# Patient Record
Sex: Female | Born: 1960 | Race: Black or African American | Hispanic: No | Smoking: Never smoker
Health system: Southern US, Community
[De-identification: ages and names within clinical notes are randomized; demographics above are authoritative.]

---

## 2014-06-05 DIAGNOSIS — M1712 Unilateral primary osteoarthritis, left knee: Secondary | ICD-10-CM | POA: Insufficient documentation

## 2017-06-01 ENCOUNTER — Ambulatory Visit: Payer: Self-pay

## 2017-06-01 ENCOUNTER — Other Ambulatory Visit: Payer: Self-pay | Admitting: Occupational Medicine

## 2017-06-01 DIAGNOSIS — M25561 Pain in right knee: Secondary | ICD-10-CM

## 2017-07-11 ENCOUNTER — Ambulatory Visit: Payer: Self-pay

## 2017-07-11 ENCOUNTER — Other Ambulatory Visit: Payer: Self-pay | Admitting: Occupational Medicine

## 2017-07-11 DIAGNOSIS — M79644 Pain in right finger(s): Secondary | ICD-10-CM

## 2018-12-30 IMAGING — DX DG FINGER THUMB 2+V*R*
3 series · 3 of 3 positions shown · non-contrast
Comparison: None.

CLINICAL DATA: 56-year-old female status post fall in May 2017
with continued right thumb pain.

EXAM:
RIGHT THUMB 2+V

[finger pa]
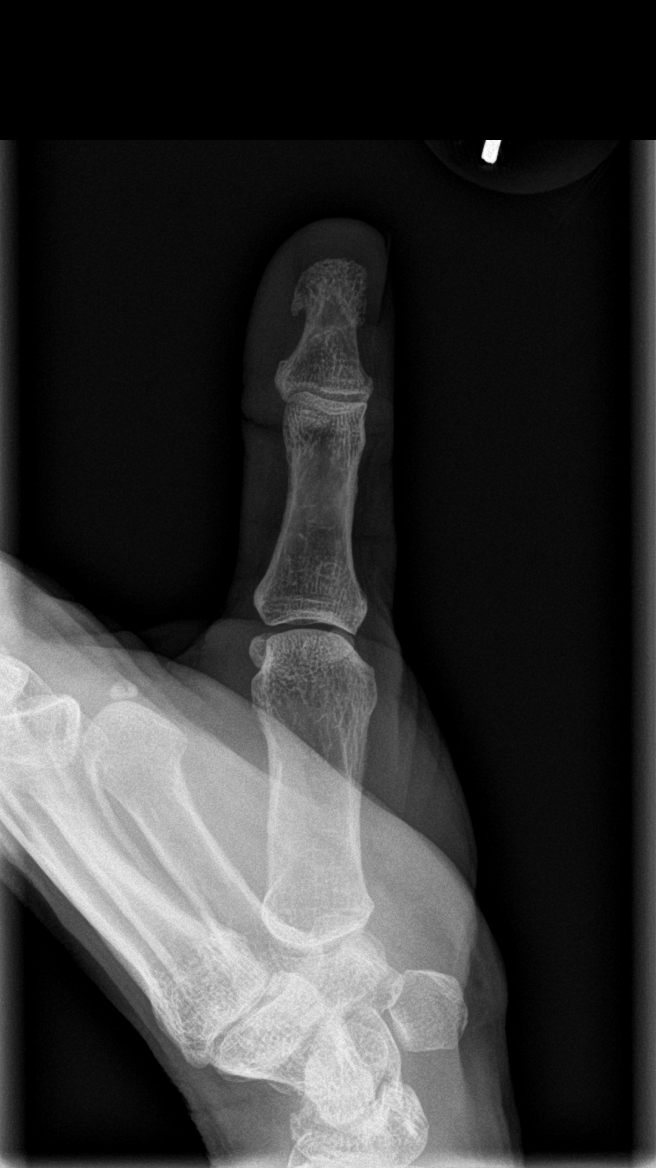

[finger obl]
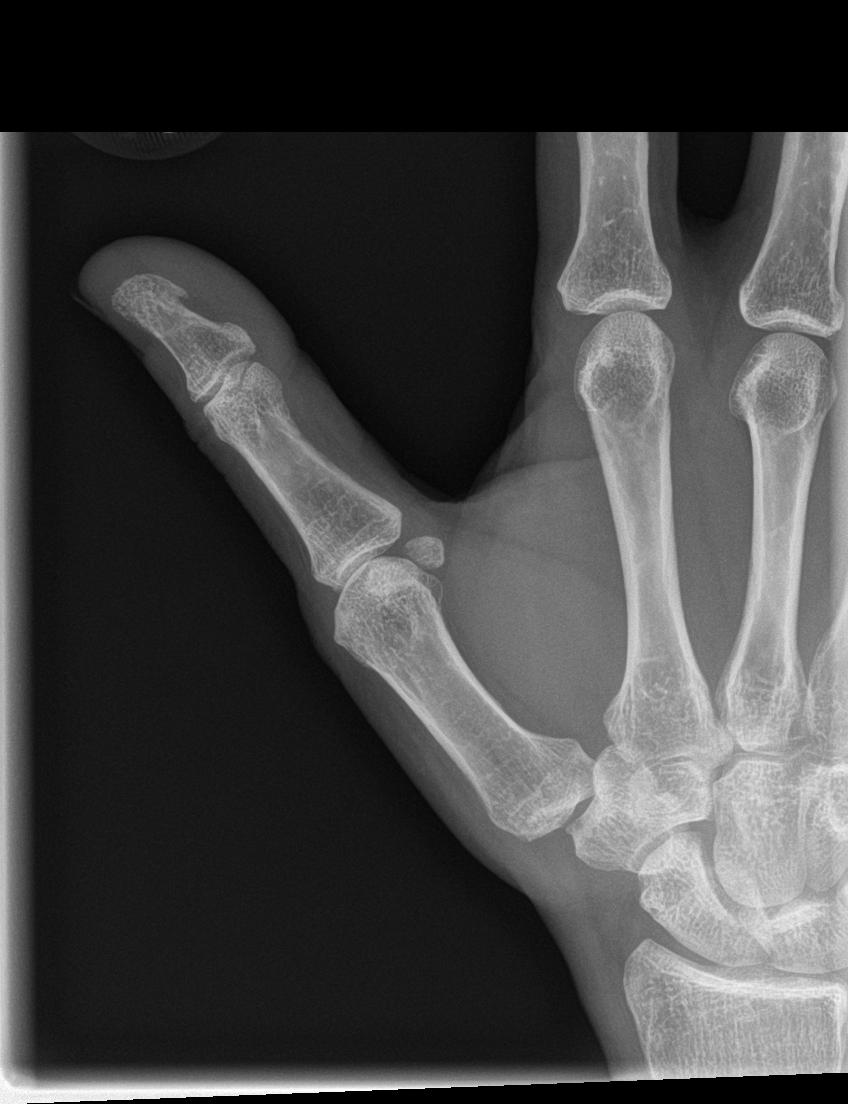

[finger lat]
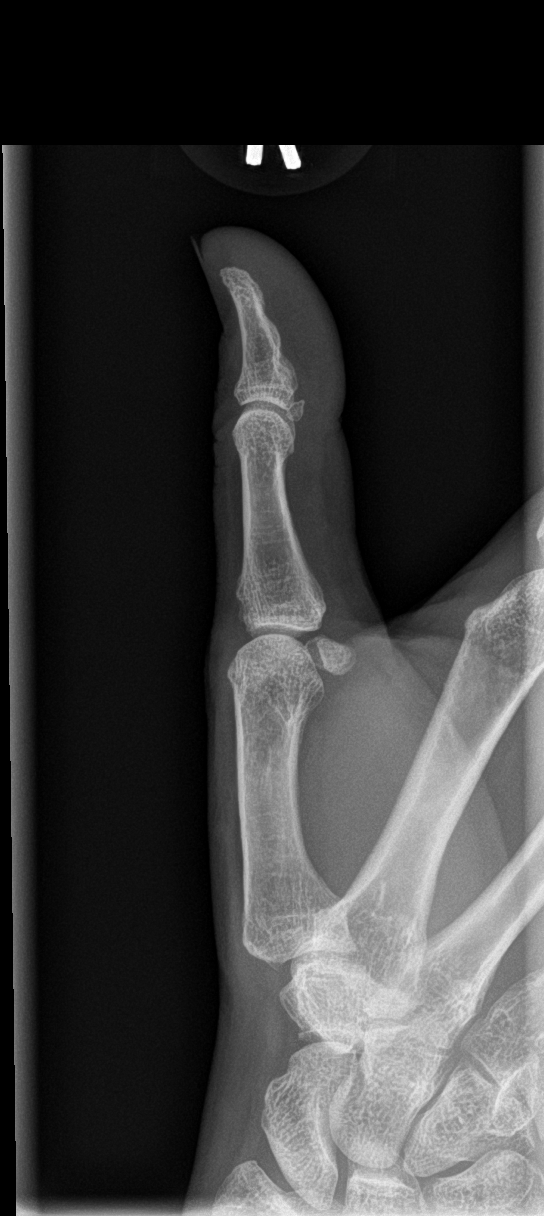

[3 of 3 positions shown; findings below may reference images not displayed]

FINDINGS: Bone mineralization is within normal limits for age. Visible radius
and carpal bones appear intact. Mild first CMC joint space loss and
subchondral sclerosis. The first metacarpal, first MCP joint, right
thumb phalanges and thumb IP joint appear within normal limits.
Small sesamoid bones are present.

Other visible metacarpals and phalanges appear intact. No acute
osseous abnormality identified.
IMPRESSION: Normal for age radiographic appearance of the right thumb.

## 2019-12-20 DIAGNOSIS — M19011 Primary osteoarthritis, right shoulder: Secondary | ICD-10-CM

## 2019-12-20 HISTORY — DX: Primary osteoarthritis, right shoulder: M19.011

## 2022-06-14 ENCOUNTER — Ambulatory Visit: Payer: Medicaid Other | Admitting: Student

## 2022-06-14 VITALS — BP 129/87 | HR 73 | Temp 98.0°F | Ht 64.0 in | Wt 186.8 lb

## 2022-06-14 DIAGNOSIS — R7303 Prediabetes: Secondary | ICD-10-CM | POA: Diagnosis not present

## 2022-06-14 DIAGNOSIS — Z7985 Long-term (current) use of injectable non-insulin antidiabetic drugs: Secondary | ICD-10-CM | POA: Diagnosis not present

## 2022-06-14 DIAGNOSIS — M5412 Radiculopathy, cervical region: Secondary | ICD-10-CM

## 2022-06-14 DIAGNOSIS — E785 Hyperlipidemia, unspecified: Secondary | ICD-10-CM

## 2022-06-14 DIAGNOSIS — Z0000 Encounter for general adult medical examination without abnormal findings: Secondary | ICD-10-CM

## 2022-06-14 DIAGNOSIS — D259 Leiomyoma of uterus, unspecified: Secondary | ICD-10-CM | POA: Diagnosis not present

## 2022-06-14 MED ORDER — GABAPENTIN 100 MG PO CAPS: 100.0000 mg | ORAL_CAPSULE | Freq: Three times a day (TID) | ORAL | 2 refills | Status: DC

## 2022-06-14 NOTE — Patient Instructions (Signed)
It was a pleasure meeting you clinic today.  For the arm pain  We will check a MRI I will send you to physical therapy Please try gabapentin 100 mg three times a day as needed for pain  Follow up 1 month

## 2022-06-17 NOTE — Progress Notes (Signed)
New Patient Office Visit  Subjective    Patient ID: Melanie Chapman, female    DOB: 29-Nov-1960  Age: 61 y.o. MRN: [id number]  CC:  Chief Complaint  Patient presents with   Arm Pain    Right arm pain/numbness "pins and needles" radiates to fingers started end on Oct    Establish Care    HPI Melanie Chapman presents to establish care previously seen at [facility] but changes insurance and is no longer covered. Please refer to problem based charting for further details and assessment and plan of current problem and chronic medical conditions.   Outpatient Encounter Medications as of 06/14/2022  Medication Sig   [DISCONTINUED] gabapentin (NEURONTIN) 100 MG capsule Take 1 capsule (100 mg total) by mouth 3 (three) times daily.   gabapentin (NEURONTIN) 100 MG capsule Take 1 capsule (100 mg total) by mouth 3 (three) times daily.   [DISCONTINUED] gabapentin (NEURONTIN) 100 MG capsule Take 1 capsule (100 mg total) by mouth 3 (three) times daily.   No facility-administered encounter medications on file as of 06/14/2022.    History reviewed. No pertinent past medical history.  History reviewed. No pertinent surgical history.  Family History  Problem Relation Age of Onset   Hypertension Mother    Bladder Cancer Mother    Hypertension Father    Breast cancer Sister    Heart disease Maternal Grandmother     Social History   Socioeconomic History   Marital status: Unknown    Spouse name: Not on file   Number of children: Not on file   Years of education: Not on file   Highest education level: Not on file  Occupational History   Not on file  Tobacco Use   Smoking status: Never   Smokeless tobacco: Never  Substance and Sexual Activity   Alcohol use: Not Currently   Drug use: Not on file   Sexual activity: Not on file  Other Topics Concern   Not on file  Social History Narrative   Lives with husband. Has 5 children who are gown. Works at  [employer] in [place] Strain: Not on [employer] Insecurity: Not on file  Transportation Needs: Not on file  Physical Activity: Not on file  Stress: Not on file  Social Connections: Not on file  Intimate Partner Violence: Not on file    ROS: negative as per HPI       Objective    BP 129/87 (BP Location: Left Arm, Patient Position: Sitting, Cuff Size: Normal)   Pulse 73   Temp 98 F (36.7 C) (Oral)   Ht '5\' 4"'$  (1.626 m)   Wt 186 lb 12.8 oz (84.7 kg)   SpO2 100%   BMI 32.06 kg/m   Physical Exam Constitutional:      Appearance: Normal appearance.  HENT:     Mouth/Throat:     Mouth: Mucous membranes are moist.     Pharynx: Oropharynx is clear.  Eyes:     Extraocular Movements: Extraocular movements intact.     Conjunctiva/sclera: Conjunctivae normal.     Pupils: Pupils are equal, round, and reactive to light.  Cardiovascular:     Rate and Rhythm: Normal rate and regular rhythm.     Comments: Radial pulses 2+ symmetric, no carotid bruit bilaterally Pulmonary:     Effort: Pulmonary effort is normal.     Breath sounds: No rhonchi or rales.  Abdominal:  General: Abdomen is flat. Bowel sounds are normal. There is no distension.     Palpations: Abdomen is soft.     Tenderness: There is no abdominal tenderness.  Musculoskeletal:        General: Normal range of motion.     Right lower leg: No edema.     Left lower leg: No edema.     Comments: Positive spurling's test on the right. Tinnel test negative, normal bulk and tone of the RUE, forearm, and hand  Skin:    General: Skin is warm and dry.     Capillary Refill: Capillary refill takes less than 2 seconds.  Neurological:     General: No focal deficit present.     Mental Status: She is alert and oriented to person, place, and time.     Sensory: No sensory deficit.     Motor: No weakness.     Coordination: Coordination normal.  Psychiatric:        Mood and Affect:  Mood normal.        Behavior: Behavior normal.     Assessment & Plan:   Problem List Items Addressed This Visit       Nervous and Auditory   Cervical radiculopathy    Patient reports paraesthesias from neck to  fingertips of the right arm with associated pain under R  shoulder blades for over a month. Similar episode in April but this is more painful. Not having much headache as back in April. Is dropping objects due to this. She was scheduled for carotid US but was not able follow up due to multiple deaths in the family. Pain resolved but returned around the end of October. Pain exacerbated by typing on the computer and lifting R arm above her head. No regular medications. Denies vision changes. MRI in May wnl. Degenerative changes noted with  loss of cervical lordosis, C4-C5, and C5-C6 on cervial neck radiographs in 2021. Positive Spurling test on exam. No carotid bruit noted. Suspect this is cervical radiculopathy.  - MRI c spine - Gabapentin 100 mg TID PRN pain - Referral to PT                Relevant Medications   gabapentin (NEURONTIN) 100 MG capsule   Other Relevant Orders   Ambulatory referral to Physical Therapy   MR CERVICAL SPINE WO CONTRAST     Genitourinary   Uterine fibroid    History of uterine fibroids. S/p multiple polypectomies. Is postmenopausal. Reports 1 episodes of irregular bleeding in January that spontaneously resolved. Was scheduled for pelvic US but did not get this done. No further bleeding. She would like to hold off on further work up for now. Will continue to monitor for more irregular bleeding.         Other   Encounter for preventative adult health care examination - Primary    [place]amm[place]       Prediabetes    Previously on ozempic but could not tolerate this. Only had 1 dose. A1c 5.8 10/27/2021. Will recheck this on 10/2022. Discussed diet and lifestyle modifications.       Hyperlipidemia    Elevated LDL with ascvd 5.8% on lipid panel  10/27/2021. Risk discussion with patient she would like minimize medications at this time. Will repeat in 1 year.       Other Visit Diagnoses     Preventative health care       Relevant Orders   Fecal occult blood, imunochemical  Return in about 4 weeks (around 07/12/2022).   Iona Beard, MD

## 2022-06-18 ENCOUNTER — Encounter: Payer: Self-pay | Admitting: Student

## 2022-06-18 DIAGNOSIS — M5412 Radiculopathy, cervical region: Secondary | ICD-10-CM

## 2022-06-18 DIAGNOSIS — D259 Leiomyoma of uterus, unspecified: Secondary | ICD-10-CM | POA: Insufficient documentation

## 2022-06-18 HISTORY — DX: Radiculopathy, cervical region: M54.12

## 2022-06-18 NOTE — Assessment & Plan Note (Addendum)
Patient reports paraesthesias from neck to  fingertips of the right arm with associated pain under R  shoulder blades for over a month. Similar episode in April but this is more painful. Not having much headache as back in April. Is dropping objects due to this. She was scheduled for carotid US but was not able follow up due to multiple deaths in the family. Pain resolved but returned around the end of October. Pain exacerbated by typing on the computer and lifting R arm above her head. No regular medications. Denies vision changes. MRI in May wnl. Degenerative changes noted with  loss of cervical lordosis, C4-C5, and C5-C6 on cervial neck radiographs in 2021. Positive Spurling test on exam. No carotid bruit noted. Suspect this is cervical radiculopathy.  - MRI c spine - Gabapentin 100 mg TID PRN pain - Referral to PT

## 2022-06-18 NOTE — Assessment & Plan Note (Addendum)
Elevated LDL with ascvd 5.8% on lipid panel 10/27/2021. Risk discussion with patient she would like minimize medications at this time. Will repeat in 1 year.

## 2022-06-18 NOTE — Assessment & Plan Note (Addendum)
Mammogram 11/27/2021:  Birads 2 benign repeat in year  Pap smear 08/17/2019: negative for intraepithelial lesion of malignancy, co testing not performed, repeat in 08/2022  Fit test ordered today for colon cancer screening

## 2022-06-18 NOTE — Assessment & Plan Note (Signed)
History of uterine fibroids. S/p multiple polypectomies. Is postmenopausal. Reports 1 episodes of irregular bleeding in January that spontaneously resolved. Was scheduled for pelvic US but did not get this done. No further bleeding. She would like to hold off on further work up for now. Will continue to monitor for more irregular bleeding.

## 2022-06-18 NOTE — Assessment & Plan Note (Signed)
Previously on ozempic but could not tolerate this. Only had 1 dose. A1c 5.8 10/27/2021. Will recheck this on 10/2022. Discussed diet and lifestyle modifications.

## 2022-06-21 NOTE — Progress Notes (Signed)
Internal Medicine Clinic Attending ? ?Case discussed with Dr. Liang  At the time of the visit.  We reviewed the resident?s history and exam and pertinent patient test results.  I agree with the assessment, diagnosis, and plan of care documented in the resident?s note. ? ?

## 2022-06-30 ENCOUNTER — Telehealth: Payer: Self-pay

## 2022-06-30 NOTE — Telephone Encounter (Signed)
Requesting to speak with a nurse about having shoulder blade pain. Please call pt back.

## 2022-06-30 NOTE — Telephone Encounter (Signed)
Return pt's call -c/o right shoulder /arm pain. MRI has been schedule on 12/31 but having problems with the authorization; pt was instructed to call to re-schedule or wait until 07/07/22 when [place] returns. Pt is requesting a steroid injection until she can get the MRI. She states the pain is severe; limits her activity/movement. And she's taking Gabapentin but it is not helping. Appt became available for tomorrow 12/28 with Dr Coy Saunas @ 1015Am; pt states she will be here.

## 2022-07-01 ENCOUNTER — Encounter: Payer: Self-pay | Admitting: Student

## 2022-07-01 ENCOUNTER — Ambulatory Visit: Payer: Medicaid Other | Admitting: Student

## 2022-07-01 VITALS — BP 142/104 | HR 63 | Ht 64.0 in | Wt 188.1 lb

## 2022-07-01 DIAGNOSIS — M19011 Primary osteoarthritis, right shoulder: Secondary | ICD-10-CM

## 2022-07-01 DIAGNOSIS — M25511 Pain in right shoulder: Secondary | ICD-10-CM

## 2022-07-01 DIAGNOSIS — M5412 Radiculopathy, cervical region: Secondary | ICD-10-CM | POA: Diagnosis not present

## 2022-07-01 MED ORDER — GABAPENTIN 100 MG PO CAPS: 300.0000 mg | ORAL_CAPSULE | Freq: Three times a day (TID) | ORAL | 2 refills | Status: AC

## 2022-07-01 MED ORDER — IBUPROFEN 200 MG PO TABS: 800.0000 mg | ORAL_TABLET | Freq: Three times a day (TID) | ORAL | 2 refills | Status: AC | PRN

## 2022-07-01 MED ORDER — ACETAMINOPHEN 500 MG PO TABS: 1000.0000 mg | ORAL_TABLET | Freq: Three times a day (TID) | ORAL | 3 refills | Status: AC

## 2022-07-01 NOTE — Progress Notes (Signed)
CC: Right upper extremity pain  HPI:  [name] is a 61 y.o. female with PMH as below who presents to clinic for worsening right arm pain/numbness/tingling. Please see problem based charting for evaluation, assessment and plan.  Past Medical History:  Diagnosis Date   Cervical radiculopathy 06/18/2022   Degenerative arthritis of right shoulder region 12/20/2019   Review of Systems:  Constitutional: Negative for fever or fatigue Eyes: Negative for visual changes Respiratory: Negative for shortness of breath MSK: Positive for right arm, hand and shoulder pain. Negative for back pain or recent injury.  Neuro: Positive for right arm numbness and tingling. Negative for arm weakness.  Physical Exam: General: Pleasant, well-appearing woman. No acute distress. Neck: Mild tenderness around the C-spine. Normal ROM.  Spurling test deferred due to patient preference. Cardiac: RRR.No murmurs, rubs or gallops. No LE edema Respiratory: Lungs CTAB. No wheezing or crackles. Abdominal: Soft, symmetric and non tender. Normal BS. Skin: Warm, dry and intact without rashes or lesions MSK: Mild ttp of the right shoulder. Mild crepitus on ROM.  No effusion. Right forearm with well-healed surgical scar.  Negative empty can test. Neuro: A&O x 3. Moves all extremities. Normal sensation to gross touch. Normal grip strength. Strength 5/5 in all extremities. Psych: Appropriate mood and affect.  Vitals:   07/01/22 1003 07/01/22 1034  BP: (!) 139/94 (!) 142/104  Pulse: 78 63  SpO2: 100%   Weight: 188 lb 1.6 oz (85.3 kg)   Height: '5\' 4"'$  (1.626 m)     Assessment & Plan:   Cervical radiculopathy Patient with a history of degenerative disc disease of the C-spine presented to clinic with worsening radiculopathy pain. Patient reports she was started on gabapentin 100 mg 3 times daily however this has not helped with her numbness and tingling. She reports constant numbness and tingling from her neck  down her right arm to her fingers. Her symptoms has worsen to the point that it is limiting her ability to do her daily ADLs. She reports taking all her gabapentin at once due to the severity of her radicular symptoms. She has MRI of the C-spine pending.  Patient requests to forego the Spurling test as this made her symptoms worse during her last office visit.  Plan: -Increase gabapentin to 300 mg 3 times daily -Follow-up MRI C-spine -Clinic follow-up in 4 weeks or as needed  Degenerative arthritis of right shoulder region Patient with a history of degenerative changes in her right shoulder dating back to 2021 as reported on imaging on Care Everywhere. She reports that since October, she has had worsening right arm pain as well as numbness and tingling down her arm to her fingertips. Right shoulder and arm pain are worse with cold air and change in weather. The pain has been limiting her ability to type properly for work, to [employer], drive or perform her daily ADLs. 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 She has tried taking ibuprofen twice a day and Tylenol PM without significant relief. She denies any recent injuries to her shoulder or neck. On exam, there is mild tenderness to palpation of the right shoulder with crepitus on range of motion of the shoulder but no noticeable masses or effusion. Empty can test was negative. Low concern for rotator cuff pathology or shoulder impingement. Based on previous imaging, exam and history, concern for worsening of her right shoulder degenerative disease. Will repeat imaging and escalate conservative management.  Plan: -Start Tylenol 1000 mg TID -Increase ibuprofen to  800 mg every 8 hours as needed for pain -Provided shoulder exercises to do daily -Check x-ray of the right shoulder -Follow-up in 4 weeks or as needed if symptoms worsen. -Consider steroid injection if pain does not improve   See Encounters Tab for problem based charting.  Patient  discussed with Dr. Lorenz Coaster, MD, MPH

## 2022-07-01 NOTE — Patient Instructions (Signed)
Thank you, Ms.Pecola Lawless for allowing [place] to provide your care today. Today, we discussed your right shoulder pain.  This is likely combination of arthritis plus cervical radiculopathy. I have made some changes to your medications and sending you home with resource for shoulder exercises.   Continue calling Physical therapy to schedule your appointment  I have ordered the following tests: Shoulder x-ray  I have ordered the following medication/changed the following medications:  Increase gabapentin to 300 mg 3 times daily Take ibuprofen 800 mg every 8 hours as needed for pain Take Tylenol 1000 mg every 8 hours as needed for pain  My Chart Access: [email or web address]  Please follow-up in 4 weeks  Please make sure to arrive 15 minutes prior to your next appointment. If you arrive late, you may be asked to reschedule.    We look forward to seeing you next time. Please call our clinic at [phone] if you have any questions or concerns. The best time to call is Monday-Friday from 9am-4pm, but there is someone available 24/7. If after hours or the weekend, call the main hospital number and ask for the Internal Medicine Resident On-Call. If you need medication refills, please notify your pharmacy one week in advance and they will send [place] a request.   Thank you for letting us take part in your care. Wishing you the best!  Lacinda Axon, MD 07/01/2022, 10:38 AM IM Resident, PGY-3 [facility]ian 41:10

## 2022-07-01 NOTE — Assessment & Plan Note (Addendum)
Patient with a history of degenerative changes in her right shoulder dating back to 2021 as reported on imaging on Care Everywhere. She reports that since October, she has had worsening right arm pain as well as numbness and tingling down her arm to her fingertips. Right shoulder and arm pain are worse with cold air and change in weather. The pain has been limiting her ability to type properly for work, to [employer], drive or perform her daily ADLs. 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 She has tried taking ibuprofen twice a day and Tylenol PM without significant relief. She denies any recent injuries to her shoulder or neck. On exam, there is mild tenderness to palpation of the right shoulder with crepitus on range of motion of the shoulder but no noticeable masses or effusion. Empty can test was negative. Low concern for rotator cuff pathology or shoulder impingement. Based on previous imaging, exam and history, concern for worsening of her right shoulder degenerative disease. Will repeat imaging and escalate conservative management.  Plan: -Start Tylenol 1000 mg TID -Increase ibuprofen to 800 mg every 8 hours as needed for pain -Provided shoulder exercises to do daily -Check x-ray of the right shoulder -Follow-up in 4 weeks or as needed if symptoms worsen. -Consider steroid injection if pain does not improve

## 2022-07-01 NOTE — Assessment & Plan Note (Signed)
Patient with a history of degenerative disc disease of the C-spine presented to clinic with worsening radiculopathy pain. Patient reports she was started on gabapentin 100 mg 3 times daily however this has not helped with her numbness and tingling. She reports constant numbness and tingling from her neck down her right arm to her fingers. Her symptoms has worsen to the point that it is limiting her ability to do her daily ADLs. She reports taking all her gabapentin at once due to the severity of her radicular symptoms. She has MRI of the C-spine pending.  Patient requests to forego the Spurling test as this made her symptoms worse during her last office visit.  Plan: -Increase gabapentin to 300 mg 3 times daily -Follow-up MRI C-spine -Clinic follow-up in 4 weeks or as needed

## 2022-07-04 ENCOUNTER — Ambulatory Visit (HOSPITAL_COMMUNITY): Payer: Medicaid Other

## 2022-07-08 NOTE — Progress Notes (Signed)
Internal Medicine Clinic Attending  Case discussed with the resident at the time of the visit.  We reviewed the resident's history and exam and pertinent patient test results.  I agree with the assessment, diagnosis, and plan of care documented in the resident's note.  

## 2022-07-15 ENCOUNTER — Ambulatory Visit (HOSPITAL_COMMUNITY): Payer: Medicaid Other

## 2022-07-15 ENCOUNTER — Ambulatory Visit (HOSPITAL_COMMUNITY)
Admission: RE | Admit: 2022-07-15 | Discharge: 2022-07-15 | Disposition: A | Discharge status: Home / Self Care | Payer: Medicaid Other | Source: Ambulatory Visit | Attending: Student in an Organized Health Care Education/Training Program | Admitting: Student in an Organized Health Care Education/Training Program

## 2022-07-15 DIAGNOSIS — M25511 Pain in right shoulder: Secondary | ICD-10-CM | POA: Insufficient documentation

## 2022-07-20 ENCOUNTER — Other Ambulatory Visit: Payer: Self-pay | Admitting: Internal Medicine

## 2022-07-20 ENCOUNTER — Ambulatory Visit
Admission: RE | Admit: 2022-07-20 | Discharge: 2022-07-20 | Disposition: A | Discharge status: Home / Self Care | Payer: Medicaid Other | Source: Ambulatory Visit | Attending: Internal Medicine | Admitting: Internal Medicine

## 2022-07-20 DIAGNOSIS — M5412 Radiculopathy, cervical region: Secondary | ICD-10-CM

## 2022-07-20 NOTE — Progress Notes (Signed)
MRI was not approved, looking back last Xray were in 2021, called patient and recommended repeating Xrays.

## 2022-07-20 NOTE — Therapy (Signed)
OUTPATIENT PHYSICAL THERAPY CERVICAL EVALUATION   Patient Name: Melanie Chapman MRN: [id number] DOB:1960-09-30, 62 y.o., female Today's Date: 07/21/2022  END OF SESSION:  PT End of Session - 07/21/22 0729     Visit Number 1    Number of Visits 13    Date for PT Re-Evaluation 09/03/22    Authorization Type [place] MEDICAID HEALTHY BLUE    Progress Note Due on Visit 10    PT Start Time 0720    PT Stop Time 0805    PT Time Calculation (min) 45 min    Activity Tolerance Patient tolerated treatment well    Behavior During Therapy [facility] for tasks assessed/performed             Past Medical History:  Diagnosis Date   Cervical radiculopathy 06/18/2022   Degenerative arthritis of right shoulder region 12/20/2019   History reviewed. No pertinent surgical history. Patient Active Problem List   Diagnosis Date Noted   Degenerative arthritis of right shoulder region 07/01/2022   Cervical radiculopathy 06/18/2022   Uterine fibroid 06/18/2022   Encounter for preventative adult health care examination 06/14/2022   Prediabetes 06/14/2022   Hyperlipidemia 06/14/2022   Primary osteoarthritis of left knee 06/05/2014    PCP: Iona Beard, MD  REFERRING PROVIDER: Velna Ochs, MD  REFERRING DIAG: [id number] (ICD-10-CM) - Cervical radiculopathy  THERAPY DIAG:  Radiculopathy, cervical region  Cramp and spasm  Abnormal posture  Rationale for Evaluation and Treatment: Rehabilitation  ONSET DATE: A few months for R arm N/T. 3 years for intermittent R arm pain  SUBJECTIVE:                                                                                                                                                                                                         SUBJECTIVE STATEMENT: Pt reports lower cervical and upper shoulder and back, L greater than R. Although until recently it was worse on the R. Pt endorses N/T of the R arm esp the tip of the index finger. The pain can  intermittently shoot up in to head and she vomited the last time it occurred. Pt denies SOB, sweating, nausea, fatigue, and weakness.  PERTINENT HISTORY:  DJD R shoulder  PAIN:  Are you having pain? Yes: NPRS scale: 6/10 Pain location: Pt reports lower cervical and upper shoulder and back, L greater than R Pain description: ache Aggravating factors: office computer work, cooking, hair hygiene Relieving factors: Heat, TENS, resting Pain range on eval 0-10/10  PRECAUTIONS: None  WEIGHT BEARING RESTRICTIONS: No  FALLS:  Has patient  fallen in last 6 months? No  LIVING ENVIRONMENT: Lives with: lives alone Lives in: House/apartment Pt notes she is able to access and be mobile within her home.  OCCUPATION: office, computer work  PLOF: Independent  PATIENT GOALS: Less pain  NEXT MD VISIT: Pt unsure  OBJECTIVE:   DIAGNOSTIC FINDINGS:  Xray cervical completed yesterday  X ray R shoulder 07/17/22 IMPRESSION: 1. Punctate calcification regional to the expected location of the insertional fibers of the rotator cuff could represent the sequela remote avulsive injury versus the sequela of calcific tendinitis/hydroxyapatite deposition disease. Clinical correlation is advised. 2. Otherwise, no explanation for patient's right shoulder pain.   PATIENT SURVEYS:  NDI 25/50  50% severe disability  COGNITION: Overall cognitive status: Within functional limits for tasks assessed  SENSATION: WFL  POSTURE: rounded shoulders and forward head c CT step off  PALPATION: TTP to bilat cervical paraspinals, upper trap, and interscapular muscles   CERVICAL ROM:   Active ROM A/PROM (deg) eval  Flexion 31 pulling pain  Extension 30 ache, throb provoked pain  Right lateral flexion 18 provoked N/T R UE  Left lateral flexion 19 ache, throb provoked pain  Right rotation 20 no issue  Left rotation 20 ache, throb provoked pain   (Blank rows = not tested)  UPPER EXTREMITY ROM: Equal  bilat, decreased R elevation due to arthritis Active ROM Right eval Left eval  Shoulder flexion    Shoulder extension    Shoulder abduction    Shoulder adduction    Shoulder extension    Shoulder internal rotation    Shoulder external rotation    Elbow flexion    Elbow extension    Wrist flexion    Wrist extension    Wrist ulnar deviation    Wrist radial deviation    Wrist pronation    Wrist supination     (Blank rows = not tested)  UPPER EXTREMITY MMT: Myotome negative and grossly 4+/5 or greater MMT Right eval Left eval  Shoulder flexion    Shoulder extension    Shoulder abduction    Shoulder adduction    Shoulder extension    Shoulder internal rotation    Shoulder external rotation    Middle trapezius    Lower trapezius    Elbow flexion    Elbow extension    Wrist flexion    Wrist extension    Wrist ulnar deviation    Wrist radial deviation    Wrist pronation    Wrist supination    Grip strength     (Blank rows = not tested)  CERVICAL SPECIAL TESTS:  Neck flexor muscle endurance test: Positive, Spurling's test: Positive, and Distraction test: Positive DNF endurance 1 sec, limited by pain  FUNCTIONAL TESTS:  NT  TODAY'S TREATMENT:  [facility] Adult PT Treatment:                                                DATE: 07/21/22 Therapeutic Exercise: Developed, instructed in, and pt completed therex as noted in HEP  Self Care: Use of moist heat as need for 15 mins Proper cervical posture    PATIENT EDUCATION:  Education details: Eval findings, POC, HEP, self care  Person educated: Patient Education method: Explanation, Demonstration, Tactile cues, Verbal cues, and Handouts Education comprehension: verbalized understanding, returned demonstration, verbal cues required, and tactile cues required  HOME EXERCISE PROGRAM: Access Code:  [email or web address] URL: [email or web address] Date: 07/21/2022 Prepared by: Gar Ponto  Exercises - Supine Cervical Retraction with Towel  - 3 x daily - 7 x weekly - 1 sets - 10 reps - 3 hold - Standing Cervical Retraction  - 1 x daily - 7 x weekly - 1 sets - 3-5 reps - 3 hold - Seated Cervical Sidebending AROM  - 6 x daily - 7 x weekly - 1 sets - 3 reps - 10 hold - Seated Cervical Rotation AROM (Mirrored)  - 6 x daily - 7 x weekly - 1 sets - 3 reps - 10 hold  ASSESSMENT:  CLINICAL IMPRESSION: Patient is a 62 y.o. female who was seen today for physical therapy evaluation and treatment for M54.12 (ICD-10-CM) - Cervical radiculopathy. Pt presents with decreased cervical ROM, increased muscle tension and tenderness with palpation, and postural dysfunction. Pain was provoked with Spurling's and improved with cervical distraction. Pt will benefit from skilled PT to address impairments for improved neck function with less pain.   OBJECTIVE IMPAIRMENTS: decreased ROM, decreased strength, increased muscle spasms, impaired UE functional use, postural dysfunction, and pain.   ACTIVITY LIMITATIONS: carrying, lifting, bending, sitting, reach over head, and caring for others  PARTICIPATION LIMITATIONS: meal prep, cleaning, laundry, driving, and occupation  PERSONAL FACTORS: Fitness, Past/current experiences, Profession, and Time since onset of injury/illness/exacerbation are also affecting patient's functional outcome.   REHAB POTENTIAL: Good  CLINICAL DECISION MAKING: Evolving/moderate complexity  EVALUATION COMPLEXITY: Moderate   GOALS:  SHORT TERM GOALS: Target date: 08/06/22  Pt will be Ind in an initial HEP  Baseline: initiated Goal status: INITIAL  2.  Pt will voice understanding of measures to assist in pain reduction  Baseline: initiated Goal status: INITIAL  LONG TERM GOALS: Target date: 09/03/22  Pt will be Ind in a final HEP to maintain achieved LOF  Baseline:  initiated Goal status: INITIAL  2.  Increase cervical ROM by 10d or greater for improved function and as indication of decreased pain. Baseline: see flow  Goal status: INITIAL  3.  Pt will demonstrate proper sitting posture to reduce cervical strain Baseline: initiated Goal status: INITIAL  4.  Pt's NDI will improve to 15/50 30%  Baseline: 25/50 50% for low- moderate disability Goal status: INITIAL  5.  Pt will report a decrease in pain to 4/10 or less for improved cervical function and QOL Baseline: 0-10/10; 6/60 on eval Goal status: INITIAL   PLAN:  PT FREQUENCY: 2x/week  PT DURATION: 6 weeks  PLANNED INTERVENTIONS: Therapeutic exercises, Therapeutic activity, Patient/Family education, Self Care, Aquatic Therapy, Dry Needling, Electrical stimulation, Spinal manipulation, Spinal mobilization, Cryotherapy, Moist heat, Taping, Traction, Ultrasound, Ionotophoresis '4mg'$ /ml Dexamethasone, Manual therapy, and Re-evaluation  PLAN FOR NEXT SESSION: Review NDI; assess response  to HEP; progress therex as indicated; use of modalities, manual therapy; and TPDN as indicated.  Keyshla Tunison MS, PT 07/21/22 9:13 AM  Check all possible CPT codes: 03212 - PT Re-evaluation, 97110- Therapeutic Exercise, 97140 - Manual Therapy, 97530 - Therapeutic Activities, 97535 - Self Care, [phone] - Electrical stimulation (Manual), [id number] - Ultrasound, and [id number] - Aquatic therapy    Check all conditions that are expected to impact treatment: Musculoskeletal disorders   If treatment provided at initial evaluation, no treatment charged due to lack of authorization.

## 2022-07-21 ENCOUNTER — Ambulatory Visit: Payer: Medicaid Other | Attending: Internal Medicine

## 2022-07-21 ENCOUNTER — Other Ambulatory Visit: Payer: Self-pay

## 2022-07-21 DIAGNOSIS — M5412 Radiculopathy, cervical region: Secondary | ICD-10-CM | POA: Insufficient documentation

## 2022-07-21 DIAGNOSIS — R293 Abnormal posture: Secondary | ICD-10-CM | POA: Insufficient documentation

## 2022-07-21 DIAGNOSIS — R252 Cramp and spasm: Secondary | ICD-10-CM | POA: Diagnosis present

## 2022-07-22 ENCOUNTER — Telehealth: Payer: Self-pay | Admitting: Internal Medicine

## 2022-07-22 MED ORDER — METHOCARBAMOL 500 MG PO TABS: 1000.0000 mg | ORAL_TABLET | Freq: Three times a day (TID) | ORAL | 0 refills | Status: DC | PRN

## 2022-07-22 NOTE — Telephone Encounter (Signed)
Reviewed Xray results, only mild DDD and does not explain symptoms. Also reports had PT visit and limited left side ROM and tenderness, feels like muscle spasm.  Has previously had robaxin '500mg'$  which was partially effective.  Discussed taking '1000mg'$  TIDPRN of robaxin along with her other medications. She will follow up with our clinic in 1 week. I think she will likely need an MRI given radicular symptoms lasting over 3 months failure of conservative therapy and lack of explanation of Xray.

## 2022-07-25 NOTE — Telephone Encounter (Signed)
Thank you :)

## 2022-07-27 ENCOUNTER — Encounter: Payer: Self-pay | Admitting: Physical Therapy

## 2022-07-27 ENCOUNTER — Ambulatory Visit: Payer: Medicaid Other | Admitting: Physical Therapy

## 2022-07-27 ENCOUNTER — Ambulatory Visit (HOSPITAL_COMMUNITY): Payer: Medicaid Other

## 2022-07-27 DIAGNOSIS — R293 Abnormal posture: Secondary | ICD-10-CM

## 2022-07-27 DIAGNOSIS — M5412 Radiculopathy, cervical region: Secondary | ICD-10-CM

## 2022-07-27 DIAGNOSIS — R252 Cramp and spasm: Secondary | ICD-10-CM

## 2022-07-27 NOTE — Therapy (Signed)
OUTPATIENT PHYSICAL THERAPY TREATMENT NOTE   Patient Name: Melanie Chapman MRN: [id number] DOB:04-12-1961, 62 y.o., female Today's Date: 07/27/2022  PCP: Iona Beard, MD   REFERRING PROVIDER: Velna Ochs, MD  END OF SESSION:   PT End of Session - 07/27/22 1504     Visit Number 2    Number of Visits 13    Date for PT Re-Evaluation 09/03/22    Authorization Type [place] MEDICAID HEALTHY BLUE    Authorization Time Period 07/22/22-09/19/22    Authorization - Visit Number 1    Authorization - Number of Visits 6    PT Start Time 0215    PT Stop Time 0315    PT Time Calculation (min) 60 min             Past Medical History:  Diagnosis Date   Cervical radiculopathy 06/18/2022   Degenerative arthritis of right shoulder region 12/20/2019   History reviewed. No pertinent surgical history. Patient Active Problem List   Diagnosis Date Noted   Degenerative arthritis of right shoulder region 07/01/2022   Cervical radiculopathy 06/18/2022   Uterine fibroid 06/18/2022   Encounter for preventative adult health care examination 06/14/2022   Prediabetes 06/14/2022   Hyperlipidemia 06/14/2022   Primary osteoarthritis of left knee 06/05/2014    REFERRING DIAG: M54.12 (ICD-10-CM) - Cervical radiculopathy  THERAPY DIAG:  Radiculopathy, cervical region  Cramp and spasm  Abnormal posture  Rationale for Evaluation and Treatment Rehabilitation  PERTINENT HISTORY:  DJD R shoulder  PRECAUTIONS: none  SUBJECTIVE:                                                                                                                                                                                      SUBJECTIVE STATEMENT:  My left shoulder and neck are worse today. Pain is 7/10.   PAIN:  Are you having pain? Yes: NPRS scale: 7/10 Pain location: Pt reports lower cervical and upper shoulder and back, L greater than R Pain description: ache Aggravating factors: office computer work,  cooking, hair hygiene Relieving factors: Heat, TENS, resting Pain range on eval 0-10/10   OBJECTIVE: (objective measures completed at initial evaluation unless otherwise dated)   DIAGNOSTIC FINDINGS:  Xray cervical completed yesterday   X ray R shoulder 07/17/22 IMPRESSION: 1. Punctate calcification regional to the expected location of the insertional fibers of the rotator cuff could represent the sequela remote avulsive injury versus the sequela of calcific tendinitis/hydroxyapatite deposition disease. Clinical correlation is advised. 2. Otherwise, no explanation for patient's right shoulder pain.   PATIENT SURVEYS:  NDI 25/50  50% severe disability   COGNITION: Overall cognitive status: Within functional limits for tasks assessed  SENSATION: WFL   POSTURE: rounded shoulders and forward head c CT step off   PALPATION: TTP to bilat cervical paraspinals, upper trap, and interscapular muscles         CERVICAL ROM:    Active ROM A/PROM (deg) eval  Flexion 31 pulling pain  Extension 30 ache, throb provoked pain  Right lateral flexion 18 provoked N/T R UE  Left lateral flexion 19 ache, throb provoked pain  Right rotation 20 no issue  Left rotation 20 ache, throb provoked pain   (Blank rows = not tested)   UPPER EXTREMITY ROM: Equal bilat, decreased R elevation due to arthritis Active ROM Right eval Left eval  Shoulder flexion      Shoulder extension      Shoulder abduction      Shoulder adduction      Shoulder extension      Shoulder internal rotation      Shoulder external rotation      Elbow flexion      Elbow extension      Wrist flexion      Wrist extension      Wrist ulnar deviation      Wrist radial deviation      Wrist pronation      Wrist supination       (Blank rows = not tested)   UPPER EXTREMITY MMT: Myotome negative and grossly 4+/5 or greater MMT Right eval Left eval  Shoulder flexion      Shoulder extension      Shoulder abduction       Shoulder adduction      Shoulder extension      Shoulder internal rotation      Shoulder external rotation      Middle trapezius      Lower trapezius      Elbow flexion      Elbow extension      Wrist flexion      Wrist extension      Wrist ulnar deviation      Wrist radial deviation      Wrist pronation      Wrist supination      Grip strength       (Blank rows = not tested)   CERVICAL SPECIAL TESTS:  Neck flexor muscle endurance test: Positive, Spurling's test: Positive, and Distraction test: Positive DNF endurance 1 sec, limited by pain   FUNCTIONAL TESTS:  NT   TODAY'S TREATMENT:   [facility] Adult PT Treatment:                                                DATE: 07/27/22 Therapeutic Exercise: Seated upper trap stretch Seated cervical rotation Seated levator stretch Seated scap squeezes  Seated shoulder rolls.  Seated chin tuck Supine chin tuck Supine chest press with chin tuck Supine pullovers with chin tuck  Supine yellow band ER bilat x 15  Supine yellow band horizontal abduction x 10  Manual Therapy: STW to upper traps bilat, cervical paraspinals, manual cervical distraction, sub occipital release  Modalities: HMP x 15 minutes , cervical  [facility] Adult PT Treatment:                                                DATE: 07/21/22 Therapeutic Exercise: Developed, instructed in, and pt completed therex as noted in HEP  Self Care: Use of moist heat as need for 15 mins Proper cervical posture     PATIENT EDUCATION:  Education details: Eval findings, POC, HEP, self care  Person educated: Patient Education method: Explanation, Demonstration, Tactile cues, Verbal cues, and Handouts Education comprehension: verbalized understanding, returned demonstration, verbal cues required, and tactile cues required   HOME EXERCISE PROGRAM: Access Code:  [email or web address] URL: [email or web address] Date: 07/21/2022 Prepared by: Gar Ponto   Exercises - Supine Cervical Retraction with Towel  - 3 x daily - 7 x weekly - 1 sets - 10 reps - 3 hold - Standing Cervical Retraction  - 1 x daily - 7 x weekly - 1 sets - 3-5 reps - 3 hold - Seated Cervical Sidebending AROM  - 6 x daily - 7 x weekly - 1 sets - 3 reps - 10 hold - Seated Cervical Rotation AROM (Mirrored)  - 6 x daily - 7 x weekly - 1 sets - 3 reps - 10 hold   ASSESSMENT:   CLINICAL IMPRESSION: Patient is a 62 y.o. female who was seen today for physical therapy treatment for M54.12 (ICD-10-CM) - Cervical radiculopathy. She reports compliance with HEP, has been avoiding stretching to the left per instructions. She was able to stretch to the right today gently. Worked on seated scapular activation and supine cervical/scap stabilization. She declined increased pain with supine therex. Some discomfort with seated stretching. Manual STW performed to cervical followed by  PROM. HMP applied end of session to further reduce tension.  Pt will benefit from skilled PT to address impairments for improved neck function with less pain.    OBJECTIVE IMPAIRMENTS: decreased ROM, decreased strength, increased muscle spasms, impaired UE functional use, postural dysfunction, and pain.    ACTIVITY LIMITATIONS: carrying, lifting, bending, sitting, reach over head, and caring for others   PARTICIPATION LIMITATIONS: meal prep, cleaning, laundry, driving, and occupation   PERSONAL FACTORS: Fitness, Past/current experiences, Profession, and Time since onset of injury/illness/exacerbation are also affecting patient's functional outcome.    REHAB POTENTIAL: Good   CLINICAL DECISION MAKING: Evolving/moderate complexity   EVALUATION COMPLEXITY: Moderate     GOALS:   SHORT TERM GOALS: Target date: 08/06/22   Pt will be Ind in an initial HEP  Baseline: initiated Goal status: INITIAL   2.  Pt will voice  understanding of measures to assist in pain reduction  Baseline: initiated Goal status: INITIAL   LONG TERM GOALS: Target date: 09/03/22   Pt will be Ind in a final HEP to maintain achieved LOF  Baseline: initiated Goal status: INITIAL   2.  Increase cervical ROM by 10d or greater for improved function and as indication of decreased pain. Baseline: see flow  Goal status: INITIAL   3.  Pt will demonstrate proper sitting posture to reduce cervical strain Baseline: initiated Goal status: INITIAL   4.  Pt's NDI will improve to 15/50 30%  Baseline: 25/50 50% for low- moderate disability Goal status: INITIAL   5.  Pt will report a decrease in pain to 4/10 or less for improved cervical function and QOL Baseline: 0-10/10; 6/60 on  eval Goal status: INITIAL     PLAN:   PT FREQUENCY: 2x/week   PT DURATION: 6 weeks   PLANNED INTERVENTIONS: Therapeutic exercises, Therapeutic activity, Patient/Family education, Self Care, Aquatic Therapy, Dry Needling, Electrical stimulation, Spinal manipulation, Spinal mobilization, Cryotherapy, Moist heat, Taping, Traction, Ultrasound, Ionotophoresis '4mg'$ /ml Dexamethasone, Manual therapy, and Re-evaluation   PLAN FOR NEXT SESSION: assess response to session and update HEP as indicated. Review NDI; assess response to HEP; progress therex as indicated; use of modalities, manual therapy; and TPDN as indicated.    Hessie Diener, PTA 07/27/22 3:05 PM Phone: [phone] Fax: [phone]

## 2022-07-28 ENCOUNTER — Ambulatory Visit: Payer: Medicaid Other | Admitting: Physical Therapy

## 2022-07-28 ENCOUNTER — Ambulatory Visit (HOSPITAL_COMMUNITY): Payer: Medicaid Other

## 2022-07-29 ENCOUNTER — Encounter: Payer: Medicaid Other | Admitting: Student

## 2022-07-30 ENCOUNTER — Ambulatory Visit: Payer: Medicaid Other | Admitting: Physical Therapy

## 2022-07-30 ENCOUNTER — Encounter: Payer: Self-pay | Admitting: Physical Therapy

## 2022-07-30 DIAGNOSIS — M5412 Radiculopathy, cervical region: Secondary | ICD-10-CM

## 2022-07-30 DIAGNOSIS — R293 Abnormal posture: Secondary | ICD-10-CM

## 2022-07-30 DIAGNOSIS — R252 Cramp and spasm: Secondary | ICD-10-CM

## 2022-07-30 NOTE — Therapy (Signed)
OUTPATIENT PHYSICAL THERAPY TREATMENT NOTE   Patient Name: Melanie Chapman MRN: [id number] DOB:Nov 10, 1960, 62 y.o., female Today's Date: 07/30/2022  PCP: Iona Beard, MD   REFERRING PROVIDER: Velna Ochs, MD  END OF SESSION:   PT End of Session - 07/30/22 0720     Visit Number 3    Number of Visits 13    Date for PT Re-Evaluation 09/03/22    Authorization Type [place] MEDICA[employer] BLUE    Authorization Time Period 07/22/22-09/19/22    Authorization - Visit Number 2    Authorization - Number of Visits 6    PT Start Time 0718    PT Stop Time 0815    PT Time Calculation (min) 57 min             Past Medical History:  Diagnosis Date   Cervical radiculopathy 06/18/2022   Degenerative arthritis of right shoulder region 12/20/2019   History reviewed. No pertinent surgical history. Patient Active Problem List   Diagnosis Date Noted   Degenerative arthritis of right shoulder region 07/01/2022   Cervical radiculopathy 06/18/2022   Uterine fibroid 06/18/2022   Encounter for preventative adult health care examination 06/14/2022   Prediabetes 06/14/2022   Hyperlipidemia 06/14/2022   Primary osteoarthritis of left knee 06/05/2014    REFERRING DIAG: M54.12 (ICD-10-CM) - Cervical radiculopathy  THERAPY DIAG:  Radiculopathy, cervical region  Cramp and spasm  Abnormal posture  Rationale for Evaluation and Treatment Rehabilitation  PERTINENT HISTORY:  DJD R shoulder  PRECAUTIONS: none  SUBJECTIVE:                                                                                                                                                                                      SUBJECTIVE STATEMENT:  My left shoulder and arm are aching today 8/10.    PAIN:  Are you having pain? Yes: NPRS scale: 8/10 Pain location: Pt reports lower cervical and upper shoulder and back, L greater than R Pain description: ache Aggravating factors: office computer work, cooking,  hair hygiene Relieving factors: Heat, TENS, resting Pain range on eval 0-10/10   OBJECTIVE: (objective measures completed at initial evaluation unless otherwise dated)   DIAGNOSTIC FINDINGS:  Xray cervical completed yesterday   X ray R shoulder 07/17/22 IMPRESSION: 1. Punctate calcification regional to the expected location of the insertional fibers of the rotator cuff could represent the sequela remote avulsive injury versus the sequela of calcific tendinitis/hydroxyapatite deposition disease. Clinical correlation is advised. 2. Otherwise, no explanation for patient's right shoulder pain.   PATIENT SURVEYS:  NDI 25/50  50% severe disability   COGNITION: Overall cognitive status: Within functional limits for tasks assessed  SENSATION: WFL   POSTURE: rounded shoulders and forward head c CT step off   PALPATION: TTP to bilat cervical paraspinals, upper trap, and interscapular muscles         CERVICAL ROM:    Active ROM A/PROM (deg) eval PROM 07/30/22  Flexion 31 pulling pain   Extension 30 ache, throb provoked pain   Right lateral flexion 18 provoked N/T R UE >/= 25  Left lateral flexion 19 ache, throb provoked pain >/= 25  Right rotation 20 no issue 45  Left rotation 20 ache, throb provoked pain 45   (Blank rows = not tested)   UPPER EXTREMITY ROM: Equal bilat, decreased R elevation due to arthritis Active ROM Right eval Left eval  Shoulder flexion      Shoulder extension      Shoulder abduction      Shoulder adduction      Shoulder extension      Shoulder internal rotation      Shoulder external rotation      Elbow flexion      Elbow extension      Wrist flexion      Wrist extension      Wrist ulnar deviation      Wrist radial deviation      Wrist pronation      Wrist supination       (Blank rows = not tested)   UPPER EXTREMITY MMT: Myotome negative and grossly 4+/5 or greater MMT Right eval Left eval  Shoulder flexion      Shoulder extension       Shoulder abduction      Shoulder adduction      Shoulder extension      Shoulder internal rotation      Shoulder external rotation      Middle trapezius      Lower trapezius      Elbow flexion      Elbow extension      Wrist flexion      Wrist extension      Wrist ulnar deviation      Wrist radial deviation      Wrist pronation      Wrist supination      Grip strength       (Blank rows = not tested)   CERVICAL SPECIAL TESTS:  Neck flexor muscle endurance test: Positive, Spurling's test: Positive, and Distraction test: Positive DNF endurance 1 sec, limited by pain   FUNCTIONAL TESTS:  NT   TODAY'S TREATMENT:   [facility] Adult PT Treatment:                                                DATE: 07/30/22 Therapeutic Exercise: UBE L2 3 min each  Pulleys 2 min flex and scaption Row Green band  Seated upper trap and levator stretches  Open books - unable to tolerate left sidelying due to shoulder pain, shifted to LTR instead Supine chest press with dowel Supine protraction with dowel  Supine red band ER bilat x 15  Supine red band horizontal abduction x 10  Supine red band diagonals - alternating Manual Therapy: STW to upper traps bilat, cervical paraspinals, manual cervical distraction, sub occipital release Of note- pt has increased left anterior shoulder pain with supine eccentric DNF Modalities: HMP x 15 minutes , cervical  [facility] Adult PT Treatment:  DATE: 07/27/22 Therapeutic Exercise: Seated upper trap stretch Seated cervical rotation Seated levator stretch Seated scap squeezes  Seated shoulder rolls.  Seated chin tuck Supine chin tuck Supine chest press with chin tuck Supine pullovers with chin tuck  Supine yellow band ER bilat x 15  Supine yellow band horizontal abduction x 10  Manual Therapy: STW to upper traps bilat, cervical paraspinals, manual cervical distraction, sub occipital release  Modalities: HMP x 15  minutes , cervical                                                                                                                              [facility] Adult PT Treatment:                                                DATE: 07/21/22 Therapeutic Exercise: Developed, instructed in, and pt completed therex as noted in HEP  Self Care: Use of moist heat as need for 15 mins Proper cervical posture     PATIENT EDUCATION:  Education details: Eval findings, POC, HEP, self care  Person educated: Patient Education method: Explanation, Demonstration, Tactile cues, Verbal cues, and Handouts Education comprehension: verbalized understanding, returned demonstration, verbal cues required, and tactile cues required   HOME EXERCISE PROGRAM: Access Code: [email or web address] URL: [email or web address] Date: 07/21/2022 Prepared by: Gar Ponto   Exercises - Supine Cervical Retraction with Towel  - 3 x daily - 7 x weekly - 1 sets - 10 reps - 3 hold - Standing Cervical Retraction  - 1 x daily - 7 x weekly - 1 sets - 3-5 reps - 3 hold - Seated Cervical Sidebending AROM  - 6 x daily - 7 x weekly - 1 sets - 3 reps - 10 hold - Seated Cervical Rotation AROM (Mirrored)  - 6 x daily - 7 x weekly - 1 sets - 3 reps - 10 hold Added 07/30/22 - Supine Shoulder Horizontal Abduction with Resistance  - 1 x daily - 7 x weekly - 2 sets - 10 reps - Alternating star pattern  - 1 x daily - 7 x weekly - 2 sets - 10 reps - Supine shoulder external rotation- KNEES BENT  - 1 x daily - 7 x weekly - 2 sets - 10 reps   ASSESSMENT:   CLINICAL IMPRESSION: Patient is a 62 y.o. female who was seen today for physical therapy treatment for M54.12 (ICD-10-CM) - Cervical radiculopathy. She reports compliance with HEP. PROM of neck improved. Able to progress supine scap stab and update HEP. Her left shoulder was more symptomatic on arrival today. She reported intense anterior shoulder pain with supine DNF-especially eccentric portion.     OBJECTIVE IMPAIRMENTS: decreased ROM, decreased strength, increased muscle spasms, impaired UE functional use, postural dysfunction, and pain.    ACTIVITY LIMITATIONS: carrying, lifting, bending, sitting,  reach over head, and caring for others   PARTICIPATION LIMITATIONS: meal prep, cleaning, laundry, driving, and occupation   PERSONAL FACTORS: Fitness, Past/current experiences, Profession, and Time since onset of injury/illness/exacerbation are also affecting patient's functional outcome.    REHAB POTENTIAL: Good   CLINICAL DECISION MAKING: Evolving/moderate complexity   EVALUATION COMPLEXITY: Moderate     GOALS:   SHORT TERM GOALS: Target date: 08/06/22   Pt will be Ind in an initial HEP  Baseline: initiated Goal status: INITIAL   2.  Pt will voice understanding of measures to assist in pain reduction  Baseline: initiated Goal status: INITIAL   LONG TERM GOALS: Target date: 09/03/22   Pt will be Ind in a final HEP to maintain achieved LOF  Baseline: initiated Goal status: INITIAL   2.  Increase cervical ROM by 10d or greater for improved function and as indication of decreased pain. Baseline: see flow  Goal status: INITIAL   3.  Pt will demonstrate proper sitting posture to reduce cervical strain Baseline: initiated Goal status: INITIAL   4.  Pt's NDI will improve to 15/50 30%  Baseline: 25/50 50% for low- moderate disability Goal status: INITIAL   5.  Pt will report a decrease in pain to 4/10 or less for improved cervical function and QOL Baseline: 0-10/10; 6/60 on eval Goal status: INITIAL     PLAN:   PT FREQUENCY: 2x/week   PT DURATION: 6 weeks   PLANNED INTERVENTIONS: Therapeutic exercises, Therapeutic activity, Patient/Family education, Self Care, Aquatic Therapy, Dry Needling, Electrical stimulation, Spinal manipulation, Spinal mobilization, Cryotherapy, Moist heat, Taping, Traction, Ultrasound, Ionotophoresis '4mg'$ /ml Dexamethasone, Manual therapy, and  Re-evaluation   PLAN FOR NEXT SESSION: assess response to session and update HEP as indicated. Review NDI; assess response to HEP; progress therex as indicated; use of modalities, manual therapy; and TPDN as indicated. Pt wants to try [employer], PTA 07/30/22 10:30 AM Phone: [phone] Fax: [phone]

## 2022-08-03 ENCOUNTER — Telehealth: Payer: Self-pay | Admitting: Student

## 2022-08-03 NOTE — Telephone Encounter (Signed)
methocarbamol (ROBAXIN) 500 MG tabl   [id number] [place], [place] - [address]

## 2022-08-04 ENCOUNTER — Other Ambulatory Visit: Payer: Self-pay | Admitting: Student

## 2022-08-04 ENCOUNTER — Ambulatory Visit: Payer: Medicaid Other

## 2022-08-04 MED ORDER — METHOCARBAMOL 500 MG PO TABS: 1000.0000 mg | ORAL_TABLET | Freq: Three times a day (TID) | ORAL | 0 refills | Status: AC | PRN

## 2022-08-04 NOTE — Telephone Encounter (Signed)
Refill sent to [facility] in [place]

## 2022-08-06 ENCOUNTER — Encounter (HOSPITAL_COMMUNITY): Payer: Self-pay

## 2022-08-06 ENCOUNTER — Encounter: Payer: Medicaid Other | Admitting: Physical Therapy

## 2022-08-06 ENCOUNTER — Ambulatory Visit (HOSPITAL_COMMUNITY): Payer: Medicaid Other

## 2022-08-09 ENCOUNTER — Ambulatory Visit: Payer: Medicaid Other

## 2022-08-11 ENCOUNTER — Ambulatory Visit: Payer: Medicaid Other | Admitting: Physical Therapy

## 2022-08-13 ENCOUNTER — Encounter: Payer: Medicaid Other | Admitting: Physical Therapy

## 2022-08-16 ENCOUNTER — Ambulatory Visit: Payer: Medicaid Other | Admitting: Physical Therapy

## 2022-08-16 NOTE — Therapy (Incomplete)
OUTPATIENT PHYSICAL THERAPY TREATMENT NOTE   Patient Name: Melanie Chapman MRN: [id number] DOB:1961/04/26, 62 y.o., female Today's Date: 08/16/2022  PCP: Iona Beard, MD   REFERRING PROVIDER: Velna Ochs, MD  END OF SESSION:     Past Medical History:  Diagnosis Date   Cervical radiculopathy 06/18/2022   Degenerative arthritis of right shoulder region 12/20/2019   No past surgical history on file. Patient Active Problem List   Diagnosis Date Noted   Degenerative arthritis of right shoulder region 07/01/2022   Cervical radiculopathy 06/18/2022   Uterine fibroid 06/18/2022   Encounter for preventative adult health care examination 06/14/2022   Prediabetes 06/14/2022   Hyperlipidemia 06/14/2022   Primary osteoarthritis of left knee 06/05/2014    REFERRING DIAG: M54.12 (ICD-10-CM) - Cervical radiculopathy  THERAPY DIAG:  No diagnosis found.  Rationale for Evaluation and Treatment Rehabilitation  PERTINENT HISTORY:  DJD R shoulder  PRECAUTIONS: none  SUBJECTIVE:                                                                                                                                                                                      SUBJECTIVE STATEMENT: ***    PAIN: *** Are you having pain? Yes: NPRS scale: 8/10 Pain location: Pt reports lower cervical and upper shoulder and back, L greater than R Pain description: ache Aggravating factors: office computer work, cooking, hair hygiene Relieving factors: Heat, TENS, resting Pain range on eval 0-10/10   OBJECTIVE: (objective measures completed at initial evaluation unless otherwise dated)   DIAGNOSTIC FINDINGS:  Xray cervical completed yesterday   X ray R shoulder 07/17/22 IMPRESSION: 1. Punctate calcification regional to the expected location of the insertional fibers of the rotator cuff could represent the sequela remote avulsive injury versus the sequela of  calcific tendinitis/hydroxyapatite deposition disease. Clinical correlation is advised. 2. Otherwise, no explanation for patient's right shoulder pain.   PATIENT SURVEYS:  NDI 25/50  50% severe disability   COGNITION: Overall cognitive status: Within functional limits for tasks assessed   SENSATION: WFL   POSTURE: rounded shoulders and forward head c CT step off   PALPATION: TTP to bilat cervical paraspinals, upper trap, and interscapular muscles         CERVICAL ROM:    Active ROM A/PROM (deg) eval PROM 07/30/22  Flexion 31 pulling pain   Extension 30 ache, throb provoked pain   Right lateral flexion 18 provoked N/T R UE >/= 25  Left lateral flexion 19 ache, throb provoked pain >/= 25  Right rotation 20 no issue 45  Left rotation 20 ache, throb provoked pain 45   (Blank rows = not tested)   UPPER EXTREMITY  ROM: Equal bilat, decreased R elevation due to arthritis Active ROM Right eval Left eval  Shoulder flexion      Shoulder extension      Shoulder abduction      Shoulder adduction      Shoulder extension      Shoulder internal rotation      Shoulder external rotation      Elbow flexion      Elbow extension      Wrist flexion      Wrist extension      Wrist ulnar deviation      Wrist radial deviation      Wrist pronation      Wrist supination       (Blank rows = not tested)   UPPER EXTREMITY MMT: Myotome negative and grossly 4+/5 or greater MMT Right eval Left eval  Shoulder flexion      Shoulder extension      Shoulder abduction      Shoulder adduction      Shoulder extension      Shoulder internal rotation      Shoulder external rotation      Middle trapezius      Lower trapezius      Elbow flexion      Elbow extension      Wrist flexion      Wrist extension      Wrist ulnar deviation      Wrist radial deviation      Wrist pronation      Wrist supination      Grip strength       (Blank rows = not tested)   CERVICAL SPECIAL TESTS:  Neck  flexor muscle endurance test: Positive, Spurling's test: Positive, and Distraction test: Positive DNF endurance 1 sec, limited by pain   FUNCTIONAL TESTS:  NT   TODAY'S TREATMENT:    08/16/22 THERAPEUTIC EXERCISE: to improve flexibility, strength and mobility.  Verbal and tactile cues throughout for technique. UBE L2 3' forward 3' backward  UT Stx 2x30" LS Stx 2x30"  Supine Chin Tuck x10  Supine Chest Press with dowel 2x10 Supine Protraction with dowel 2x10  Seated Rows RTB 2x10 (Low Rows?)  Seated ER RTB 2x10 Seated Horizontal Abd RTB x10 Seated Diagonals RTB x10 Standing Open Book    Heat Moist Pad?    [facility] Adult PT Treatment:                                                DATE: 07/30/22 Therapeutic Exercise: UBE L2 3 min each  Pulleys 2 min flex and scaption Row Green band  Seated upper trap and levator stretches  Open books - unable to tolerate left sidelying due to shoulder pain, shifted to LTR instead Supine chest press with dowel Supine protraction with dowel  Supine red band ER bilat x 15  Supine red band horizontal abduction x 10  Supine red band diagonals - alternating Manual Therapy: STW to upper traps bilat, cervical paraspinals, manual cervical distraction, sub occipital release Of note- pt has increased left anterior shoulder pain with supine eccentric DNF Modalities: HMP x 15 minutes , cervical  [facility] Adult PT Treatment:  DATE: 07/27/22 Therapeutic Exercise: Seated upper trap stretch Seated cervical rotation Seated levator stretch Seated scap squeezes  Seated shoulder rolls.  Seated chin tuck Supine chin tuck Supine chest press with chin tuck Supine pullovers with chin tuck  Supine yellow band ER bilat x 15  Supine yellow band horizontal abduction x 10  Manual Therapy: STW to upper traps bilat, cervical paraspinals, manual cervical distraction, sub occipital release  Modalities: HMP x 15 minutes ,  cervical                                                                                                                              [facility] Adult PT Treatment:                                                DATE: 07/21/22 Therapeutic Exercise: Developed, instructed in, and pt completed therex as noted in HEP  Self Care: Use of moist heat as need for 15 mins Proper cervical posture     PATIENT EDUCATION:  Education details: Eval findings, POC, HEP, self care  Person educated: Patient Education method: Explanation, Demonstration, Tactile cues, Verbal cues, and Handouts Education comprehension: verbalized understanding, returned demonstration, verbal cues required, and tactile cues required   HOME EXERCISE PROGRAM: Access Code: [email or web address] URL: [email or web address] Date: 07/21/2022 Prepared by: Gar Ponto   Exercises - Supine Cervical Retraction with Towel  - 3 x daily - 7 x weekly - 1 sets - 10 reps - 3 hold - Standing Cervical Retraction  - 1 x daily - 7 x weekly - 1 sets - 3-5 reps - 3 hold - Seated Cervical Sidebending AROM  - 6 x daily - 7 x weekly - 1 sets - 3 reps - 10 hold - Seated Cervical Rotation AROM (Mirrored)  - 6 x daily - 7 x weekly - 1 sets - 3 reps - 10 hold Added 07/30/22 - Supine Shoulder Horizontal Abduction with Resistance  - 1 x daily - 7 x weekly - 2 sets - 10 reps - Alternating star pattern  - 1 x daily - 7 x weekly - 2 sets - 10 reps - Supine shoulder external rotation- KNEES BENT  - 1 x daily - 7 x weekly - 2 sets - 10 reps   ASSESSMENT:   CLINICAL IMPRESSION: ***    Patient is a 62 y.o. female who was seen today for physical therapy treatment for M54.12 (ICD-10-CM) - Cervical radiculopathy. She reports compliance with HEP. PROM of neck improved. Able to progress supine scap stab and update HEP. Her left shoulder was more symptomatic on arrival today. She reported intense anterior shoulder pain with supine DNF-especially eccentric portion.     OBJECTIVE IMPAIRMENTS: decreased ROM, decreased strength, increased muscle spasms, impaired UE functional use, postural dysfunction, and pain.    ACTIVITY LIMITATIONS:  carrying, lifting, bending, sitting, reach over head, and caring for others   PARTICIPATION LIMITATIONS: meal prep, cleaning, laundry, driving, and occupation   PERSONAL FACTORS: Fitness, Past/current experiences, Profession, and Time since onset of injury/illness/exacerbation are also affecting patient's functional outcome.    REHAB POTENTIAL: Good   CLINICAL DECISION MAKING: Evolving/moderate complexity   EVALUATION COMPLEXITY: Moderate     GOALS:   SHORT TERM GOALS: Target date: 08/06/22   Pt will be Ind in an initial HEP  Baseline: initiated Goal status: INITIAL   2.  Pt will voice understanding of measures to assist in pain reduction  Baseline: initiated Goal status: INITIAL   LONG TERM GOALS: Target date: 09/03/22   Pt will be Ind in a final HEP to maintain achieved LOF  Baseline: initiated Goal status: INITIAL   2.  Increase cervical ROM by 10d or greater for improved function and as indication of decreased pain. Baseline: see flow  Goal status: INITIAL   3.  Pt will demonstrate proper sitting posture to reduce cervical strain Baseline: initiated Goal status: INITIAL   4.  Pt's NDI will improve to 15/50 30%  Baseline: 25/50 50% for low- moderate disability Goal status: INITIAL   5.  Pt will report a decrease in pain to 4/10 or less for improved cervical function and QOL Baseline: 0-10/10; 6/60 on eval Goal status: INITIAL     PLAN:   PT FREQUENCY: 2x/week   PT DURATION: 6 weeks   PLANNED INTERVENTIONS: Therapeutic exercises, Therapeutic activity, Patient/Family education, Self Care, Aquatic Therapy, Dry Needling, Electrical stimulation, Spinal manipulation, Spinal mobilization, Cryotherapy, Moist heat, Taping, Traction, Ultrasound, Ionotophoresis 25m/ml Dexamethasone, Manual therapy, and  Re-evaluation   PLAN FOR NEXT SESSION: ***   assess response to session and update HEP as indicated. Review NDI; assess response to HEP; progress therex as indicated; use of modalities, manual therapy; and TPDN as indicated. Pt wants to try [facility] PTA 08/16/22 1:43 PM Phone: [phone]: [phone]

## 2022-08-17 ENCOUNTER — Encounter: Payer: Medicaid Other | Admitting: Physical Therapy

## 2022-08-18 ENCOUNTER — Ambulatory Visit: Payer: Medicaid Other | Admitting: Physical Therapy

## 2022-08-20 ENCOUNTER — Encounter: Payer: Medicaid Other | Admitting: Physical Therapy

## 2022-08-23 ENCOUNTER — Ambulatory Visit: Payer: Medicaid Other | Attending: Internal Medicine | Admitting: Physical Therapy

## 2022-08-23 ENCOUNTER — Encounter: Payer: Self-pay | Admitting: Physical Therapy

## 2022-08-23 DIAGNOSIS — M5412 Radiculopathy, cervical region: Secondary | ICD-10-CM | POA: Insufficient documentation

## 2022-08-23 DIAGNOSIS — R252 Cramp and spasm: Secondary | ICD-10-CM | POA: Diagnosis present

## 2022-08-23 DIAGNOSIS — R293 Abnormal posture: Secondary | ICD-10-CM | POA: Diagnosis present

## 2022-08-23 NOTE — Therapy (Signed)
OUTPATIENT PHYSICAL THERAPY TREATMENT NOTE   Patient Name: Melanie Chapman MRN: [id number] DOB:03-03-1961, 62 y.o., female Today's Date: 08/23/2022  END OF SESSION:   PT End of Session - 08/23/22 1645     Visit Number 4    Number of Visits 13    Date for PT Re-Evaluation 09/03/22    Authorization Type [place] MEDICAID HEALTHY BLUE    Authorization Time Period 07/22/22-09/19/22    Authorization - Visit Number 3    Authorization - Number of Visits 6    PT Start Time 1700    PT Stop Time 1756    PT Time Calculation (min) 56 min    Activity Tolerance Patient tolerated treatment well    Behavior During Therapy [facility] for tasks assessed/performed              Past Medical History:  Diagnosis Date   Cervical radiculopathy 06/18/2022   Degenerative arthritis of right shoulder region 12/20/2019   History reviewed. No pertinent surgical history. Patient Active Problem List   Diagnosis Date Noted   Degenerative arthritis of right shoulder region 07/01/2022   Cervical radiculopathy 06/18/2022   Uterine fibroid 06/18/2022   Encounter for preventative adult health care examination 06/14/2022   Prediabetes 06/14/2022   Hyperlipidemia 06/14/2022   Primary osteoarthritis of left knee 06/05/2014   PCP: Iona Beard, MD   REFERRING PROVIDER: Velna Ochs, MD  REFERRING DIAG: [phone] (ICD-10-CM) - Cervical radiculopathy  THERAPY DIAG:  Radiculopathy, cervical region  Cramp and spasm  Abnormal posture  RATIONALE FOR EVALUATION AND TREATMENT: Rehabilitation  PRECAUTIONS: none  NEXT MD VISIT: 09/02/22    SUBJECTIVE:                                                                                                                                                                                      SUBJECTIVE STATEMENT: Had steroid injection on 2/7 in cervical neck which has been helping, has had a lot of N/T bilat hands and has been going on for two weeks. Has now gotten an MRI on  the neck    PAIN:  Are you having pain? Yes: NPRS scale: 5/10 Pain location: Pt reports lower cervical and upper shoulder and back, L greater than R Pain description: ache Aggravating factors: office computer work, cooking, hair hygiene Relieving factors: Heat, TENS, resting Pain range on eval 0-10/10  PERTINENT HISTORY:  DJD R shoulder   OBJECTIVE: (objective measures completed at initial evaluation unless otherwise dated)  DIAGNOSTIC FINDINGS:  Xray cervical completed yesterday   X ray R shoulder 07/17/22 IMPRESSION: 1. Punctate calcification regional to the expected location of the insertional fibers of the rotator cuff could  represent the sequela remote avulsive injury versus the sequela of calcific tendinitis/hydroxyapatite deposition disease. Clinical correlation is advised. 2. Otherwise, no explanation for patient's right shoulder pain.   PATIENT SURVEYS:  NDI 25/50  50% severe disability   COGNITION: Overall cognitive status: Within functional limits for tasks assessed   SENSATION: WFL   POSTURE: rounded shoulders and forward head c CT step off   PALPATION: TTP to bilat cervical paraspinals, upper trap, and interscapular muscles         CERVICAL ROM:    Active ROM A/PROM (deg) eval PROM 07/30/22  Flexion 31 pulling pain   Extension 30 ache, throb provoked pain   Right lateral flexion 18 provoked N/T R UE >/= 25  Left lateral flexion 19 ache, throb provoked pain >/= 25  Right rotation 20 no issue 45  Left rotation 20 ache, throb provoked pain 45   (Blank rows = not tested)   UPPER EXTREMITY ROM: Equal bilat, decreased R elevation due to arthritis Active ROM Right eval Left eval  Shoulder flexion      Shoulder extension      Shoulder abduction      Shoulder adduction      Shoulder extension      Shoulder internal rotation      Shoulder external rotation      Elbow flexion      Elbow extension      Wrist flexion      Wrist extension       Wrist ulnar deviation      Wrist radial deviation      Wrist pronation      Wrist supination       (Blank rows = not tested)   UPPER EXTREMITY MMT: Myotome negative and grossly 4+/5 or greater MMT Right eval Left eval  Shoulder flexion      Shoulder extension      Shoulder abduction      Shoulder adduction      Shoulder extension      Shoulder internal rotation      Shoulder external rotation      Middle trapezius      Lower trapezius      Elbow flexion      Elbow extension      Wrist flexion      Wrist extension      Wrist ulnar deviation      Wrist radial deviation      Wrist pronation      Wrist supination      Grip strength       (Blank rows = not tested)   CERVICAL SPECIAL TESTS:  Neck flexor muscle endurance test: Positive, Spurling's test: Positive, and Distraction test: Positive DNF endurance 1 sec, limited by pain   FUNCTIONAL TESTS:  NT   TODAY'S TREATMENT:    08/23/22 THERAPEUTIC EXERCISE: to improve flexibility, strength and mobility.  Verbal and tactile cues throughout for technique. UBE L2 3' forward 3' backward  UT Stx 2x30" LS Stx 2x30"  Median Nerve Glides x10 bilat Supine Chin Tuck x10 x10" Standing Rows & Low Rows RTB 2x10  Seated ER RTB 2x10 Seated Horizontal Abd RTB 2x10 Seated Diagonals RTB 2x10 Standing Lat Pulldowns 2x10 RTB  Open Book x10 5" hold bilat  Serratus 3-way Taps RTB x10 each  Serratus Roll with towel x10-caused some increase N/T in bilat hands so had to take frequent breaks  MANUAL THERAPY: To promote normalized muscle tension, improved joint mobility, increased ROM, and  reduced pain. manual cervical distraction, sub occipital release   [facility] Adult PT Treatment:                                                DATE: 07/30/22 Therapeutic Exercise: UBE L2 3 min each  Pulleys 2 min flex and scaption Row Green band  Seated upper trap and levator stretches  Open books - unable to tolerate left sidelying due to shoulder  pain, shifted to LTR instead Supine chest press with dowel Supine protraction with dowel  Supine red band ER bilat x 15  Supine red band horizontal abduction x 10  Supine red band diagonals - alternating Manual Therapy: STW to upper traps bilat, cervical paraspinals, manual cervical distraction, sub occipital release Of note- pt has increased left anterior shoulder pain with supine eccentric DNF Modalities: HMP x 15 minutes , cervical   [facility] Adult PT Treatment:                                                DATE: 07/27/22 Therapeutic Exercise: Seated upper trap stretch Seated cervical rotation Seated levator stretch Seated scap squeezes  Seated shoulder rolls.  Seated chin tuck Supine chin tuck Supine chest press with chin tuck Supine pullovers with chin tuck  Supine yellow band ER bilat x 15  Supine yellow band horizontal abduction x 10  Manual Therapy: STW to upper traps bilat, cervical paraspinals, manual cervical distraction, sub occipital release Modalities: HMP x 15 minutes , cervical    PATIENT EDUCATION:  Education details: POC, HEP update Person educated: Patient Education method: Explanation, Demonstration, Tactile cues, Verbal cues, and Handouts Education comprehension: verbalized understanding, returned demonstration, verbal cues required, and tactile cues required   HOME EXERCISE PROGRAM: Access Code: [email or web address] URL: [email or web address] Date: 08/23/2022 Prepared by: Verdene Lennert Romero-Perozo  Exercises - Supine Cervical Retraction with Towel  - 3 x daily - 7 x weekly - 1 sets - 10 reps - 3 hold - Standing Cervical Retraction  - 1 x daily - 7 x weekly - 1 sets - 3-5 reps - 3 hold - Seated Cervical Sidebending AROM  - 6 x daily - 7 x weekly - 1 sets - 3 reps - 10 hold - Seated Cervical Rotation AROM (Mirrored)  - 6 x daily - 7 x weekly - 1 sets - 3 reps - 10 hold - Supine Shoulder Horizontal Abduction with Resistance  - 1 x daily - 3-4 x weekly -  2 sets - 10 reps - Alternating star pattern  - 1 x daily - 3-4 x weekly - 2 sets - 10 reps - Supine shoulder external rotation- KNEES BENT  - 1 x daily - 3-4 x weekly - 2 sets - 10 reps - Median Nerve Flossing  - 1 x daily - 7 x weekly - 2 sets - 10 reps - Standing Shoulder Row with Anchored Resistance  - 1 x daily - 3-4 x weekly - 2 sets - 10 reps - Shoulder extension with resistance - Neutral  - 1 x daily - 3-4 x weekly - 2 sets - 10 reps - Sidelying Thoracic Rotation with Open Book  - 1 x daily - 7 x weekly - 2 sets - 10 reps -  5 seconds hold    ASSESSMENT:   CLINICAL IMPRESSION:  Pt came into today's session after being out for almost a month. Pt states she received a steroid injection on 2/7 and has been feeling a lot better since then. Pt states that she is able to perform her HEP without a problem and notes that the main concern at the moment is her N/T in her hands. Today's session focused on reviewing previous exercises to make sure pt had no questions or concerns and pt responded well to them. Pt received a median nerve glide exercise to try and target her N/T and this seemed to ease her sx after a few repetitions. Cervical distraction and suboccipital release was conducted to decrease muscle tension and the radicular sx which the pt has stated has benefited in the past. Pt continues to benefit from skilled PT interventions to address her radicular sx while decreasing her pain with daily activities.   OBJECTIVE IMPAIRMENTS: decreased ROM, decreased strength, increased muscle spasms, impaired UE functional use, postural dysfunction, and pain.    ACTIVITY LIMITATIONS: carrying, lifting, bending, sitting, reach over head, and caring for others   PARTICIPATION LIMITATIONS: meal prep, cleaning, laundry, driving, and occupation   PERSONAL FACTORS: Fitness, Past/current experiences, Profession, and Time since onset of injury/illness/exacerbation are also affecting patient's functional outcome.     REHAB POTENTIAL: Good   CLINICAL DECISION MAKING: Evolving/moderate complexity   EVALUATION COMPLEXITY: Moderate     GOALS:   SHORT TERM GOALS: Target date: 08/06/22   Pt will be Ind in an initial HEP  Baseline: initiated Goal status: MET 08/23/22   2.  Pt will voice understanding of measures to assist in pain reduction  Baseline: initiated Goal status: MET 08/23/22   LONG TERM GOALS: Target date: 09/03/22   Pt will be Ind in a final HEP to maintain achieved LOF  Baseline: initiated Goal status: IN PROGRESS   2.  Increase cervical ROM by 10d or greater for improved function and as indication of decreased pain. Baseline: see flow  Goal status: IN PROGRESS   3.  Pt will demonstrate proper sitting posture to reduce cervical strain Baseline: initiated Goal status: IN PROGRESS   4.  Pt's NDI will improve to 15/50 30%  Baseline: 25/50 50% for low- moderate disability Goal status: IN PROGRESS   5.  Pt will report a decrease in pain to 4/10 or less for improved cervical function and QOL Baseline: 0-10/10; 6/60 on eval Goal status: IN PROGRESS     PLAN:   PT FREQUENCY: 2x/week   PT DURATION: 6 weeks   PLANNED INTERVENTIONS: Therapeutic exercises, Therapeutic activity, Patient/Family education, Self Care, Aquatic Therapy, Dry Needling, Electrical stimulation, Spinal manipulation, Spinal mobilization, Cryotherapy, Moist heat, Taping, Traction, Ultrasound, Ionotophoresis 68m/ml Dexamethasone, Manual therapy, and Re-evaluation   PLAN FOR NEXT SESSION: assess response to session and update HEP as indicated; progress therex as indicated; use of modalities, manual therapy; and TPDN as indicated.    Channin Agustin Romero-Perozo, Student-PT 08/23/2022, 6:06 PM

## 2022-08-25 ENCOUNTER — Ambulatory Visit: Payer: Medicaid Other | Admitting: Physical Therapy

## 2022-08-26 ENCOUNTER — Ambulatory Visit: Payer: Medicaid Other | Admitting: Physical Therapy

## 2022-08-26 NOTE — Therapy (Incomplete)
OUTPATIENT PHYSICAL THERAPY TREATMENT NOTE   Patient Name: Melanie Chapman MRN: [id number] DOB:[birth year], 62 y.o., female Today's Date: 08/26/2022  END OF SESSION:      Past Medical History:  Diagnosis Date   Cervical radiculopathy 06/18/2022   Degenerative arthritis of right shoulder region 12/20/2019   No past surgical history on file. Patient Active Problem List   Diagnosis Date Noted   Degenerative arthritis of right shoulder region 07/01/2022   Cervical radiculopathy 06/18/2022   Uterine fibroid 06/18/2022   Encounter for preventative adult health care examination 06/14/2022   Prediabetes 06/14/2022   Hyperlipidemia 06/14/2022   Primary osteoarthritis of left knee 06/05/2014   PCP: Iona Beard, MD   REFERRING PROVIDER: Velna Ochs, MD  REFERRING DIAG: [phone] (ICD-10-CM) - Cervical radiculopathy  THERAPY DIAG:  No diagnosis found.  RATIONALE FOR EVALUATION AND TREATMENT: Rehabilitation  PRECAUTIONS: none  NEXT MD VISIT: 09/02/22    SUBJECTIVE:                                                                                                                                                                                      SUBJECTIVE STATEMENT: ***   Had steroid injection on 2/7 in cervical neck which has been helping, has had a lot of N/T bilat hands and has been going on for two weeks. Has now gotten an MRI on the neck    PAIN:  Are you having pain? Yes: NPRS scale: 5/10 Pain location: Pt reports lower cervical and upper shoulder and back, L greater than R Pain description: ache Aggravating factors: office computer work, cooking, hair hygiene Relieving factors: Heat, TENS, resting Pain range on eval 0-10/10  PERTINENT HISTORY:  DJD R shoulder   OBJECTIVE: (objective measures completed at initial evaluation unless otherwise dated)  DIAGNOSTIC FINDINGS:  Xray cervical completed yesterday   X ray R shoulder 07/17/22 IMPRESSION: 1.  Punctate calcification regional to the expected location of the insertional fibers of the rotator cuff could represent the sequela remote avulsive injury versus the sequela of calcific tendinitis/hydroxyapatite deposition disease. Clinical correlation is advised. 2. Otherwise, no explanation for patient's right shoulder pain.   PATIENT SURVEYS:  NDI 25/50  50% severe disability   COGNITION: Overall cognitive status: Within functional limits for tasks assessed   SENSATION: WFL   POSTURE: rounded shoulders and forward head c CT step off   PALPATION: TTP to bilat cervical paraspinals, upper trap, and interscapular muscles         CERVICAL ROM:    Active ROM A/PROM (deg) eval PROM 07/30/22  Flexion 31 pulling pain   Extension 30 ache, throb provoked pain   Right lateral flexion 18  provoked N/T R UE >/= 25  Left lateral flexion 19 ache, throb provoked pain >/= 25  Right rotation 20 no issue 45  Left rotation 20 ache, throb provoked pain 45   (Blank rows = not tested)   UPPER EXTREMITY ROM: Equal bilat, decreased R elevation due to arthritis Active ROM Right eval Left eval  Shoulder flexion      Shoulder extension      Shoulder abduction      Shoulder adduction      Shoulder extension      Shoulder internal rotation      Shoulder external rotation      Elbow flexion      Elbow extension      Wrist flexion      Wrist extension      Wrist ulnar deviation      Wrist radial deviation      Wrist pronation      Wrist supination       (Blank rows = not tested)   UPPER EXTREMITY MMT: Myotome negative and grossly 4+/5 or greater MMT Right eval Left eval  Shoulder flexion      Shoulder extension      Shoulder abduction      Shoulder adduction      Shoulder extension      Shoulder internal rotation      Shoulder external rotation      Middle trapezius      Lower trapezius      Elbow flexion      Elbow extension      Wrist flexion      Wrist extension      Wrist  ulnar deviation      Wrist radial deviation      Wrist pronation      Wrist supination      Grip strength       (Blank rows = not tested)   CERVICAL SPECIAL TESTS:  Neck flexor muscle endurance test: Positive, Spurling's test: Positive, and Distraction test: Positive DNF endurance 1 sec, limited by pain   FUNCTIONAL TESTS:  NT   TODAY'S TREATMENT:    08/26/22 THERAPEUTIC EXERCISE: to improve flexibility, strength and mobility.  Verbal and tactile cues throughout for technique. UBE L2 3' forward 3' backward   Green Ball at wall with shoulder 90 flexion: Up/Down, S/S, CC/CW x20 each  Prone I's,Y's, T's on GSB x10 each   Median Nerve glides Seated Horizontal Abd RTB 2x10 Seated Diagonals RTB 2x10 Standing Lat Pulldowns 2x10 RTB  Open Book x10 5" hold bilat  Serratus 3-way Taps RTB x10 each  Serratus Roll with towel x10-caused some increase N/T in bilat hands so had to take frequent breaks    08/23/22 THERAPEUTIC EXERCISE: to improve flexibility, strength and mobility.  Verbal and tactile cues throughout for technique. UBE L2 3' forward 3' backward  UT Stx 2x30" LS Stx 2x30"  Median Nerve Glides x10 bilat Supine Chin Tuck x10 x10" Standing Rows & Low Rows RTB 2x10  Seated ER RTB 2x10 Seated Horizontal Abd RTB 2x10 Seated Diagonals RTB 2x10 Standing Lat Pulldowns 2x10 RTB  Open Book x10 5" hold bilat  Serratus 3-way Taps RTB x10 each  Serratus Roll with towel x10-caused some increase N/T in bilat hands so had to take frequent breaks  MANUAL THERAPY: To promote normalized muscle tension, improved joint mobility, increased ROM, and reduced pain. manual cervical distraction, sub occipital release   [facility] Adult PT Treatment:  DATE: 07/30/22 Therapeutic Exercise: UBE L2 3 min each  Pulleys 2 min flex and scaption Row Green band  Seated upper trap and levator stretches  Open books - unable to tolerate left sidelying due to  shoulder pain, shifted to LTR instead Supine chest press with dowel Supine protraction with dowel  Supine red band ER bilat x 15  Supine red band horizontal abduction x 10  Supine red band diagonals - alternating Manual Therapy: STW to upper traps bilat, cervical paraspinals, manual cervical distraction, sub occipital release Of note- pt has increased left anterior shoulder pain with supine eccentric DNF Modalities: HMP x 15 minutes , cervical   [facility] Adult PT Treatment:                                                DATE: 07/27/22 Therapeutic Exercise: Seated upper trap stretch Seated cervical rotation Seated levator stretch Seated scap squeezes  Seated shoulder rolls.  Seated chin tuck Supine chin tuck Supine chest press with chin tuck Supine pullovers with chin tuck  Supine yellow band ER bilat x 15  Supine yellow band horizontal abduction x 10  Manual Therapy: STW to upper traps bilat, cervical paraspinals, manual cervical distraction, sub occipital release Modalities: HMP x 15 minutes , cervical    PATIENT EDUCATION:  Education details: POC, HEP update Person educated: Patient Education method: Explanation, Demonstration, Tactile cues, Verbal cues, and Handouts Education comprehension: verbalized understanding, returned demonstration, verbal cues required, and tactile cues required   HOME EXERCISE PROGRAM: Access Code: [email or web address] URL: [email or web address] Date: 08/23/2022 Prepared by: Verdene Lennert Romero-Perozo  Exercises - Supine Cervical Retraction with Towel  - 3 x daily - 7 x weekly - 1 sets - 10 reps - 3 hold - Standing Cervical Retraction  - 1 x daily - 7 x weekly - 1 sets - 3-5 reps - 3 hold - Seated Cervical Sidebending AROM  - 6 x daily - 7 x weekly - 1 sets - 3 reps - 10 hold - Seated Cervical Rotation AROM (Mirrored)  - 6 x daily - 7 x weekly - 1 sets - 3 reps - 10 hold - Supine Shoulder Horizontal Abduction with Resistance  - 1 x daily - 3-4 x  weekly - 2 sets - 10 reps - Alternating star pattern  - 1 x daily - 3-4 x weekly - 2 sets - 10 reps - Supine shoulder external rotation- KNEES BENT  - 1 x daily - 3-4 x weekly - 2 sets - 10 reps - Median Nerve Flossing  - 1 x daily - 7 x weekly - 2 sets - 10 reps - Standing Shoulder Row with Anchored Resistance  - 1 x daily - 3-4 x weekly - 2 sets - 10 reps - Shoulder extension with resistance - Neutral  - 1 x daily - 3-4 x weekly - 2 sets - 10 reps - Sidelying Thoracic Rotation with Open Book  - 1 x daily - 7 x weekly - 2 sets - 10 reps - 5 seconds hold    ASSESSMENT:   CLINICAL IMPRESSION: ***   Pt came into today's session after being out for almost a month. Pt states she received a steroid injection on 2/7 and has been feeling a lot better since then. Pt states that she is able to perform her HEP without a problem and notes that  the main concern at the moment is her N/T in her hands. Today's session focused on reviewing previous exercises to make sure pt had no questions or concerns and pt responded well to them. Pt received a median nerve glide exercise to try and target her N/T and this seemed to ease her sx after a few repetitions. Cervical distraction and suboccipital release was conducted to decrease muscle tension and the radicular sx which the pt has stated has benefited in the past. Pt continues to benefit from skilled PT interventions to address her radicular sx while decreasing her pain with daily activities.   OBJECTIVE IMPAIRMENTS: decreased ROM, decreased strength, increased muscle spasms, impaired UE functional use, postural dysfunction, and pain.    ACTIVITY LIMITATIONS: carrying, lifting, bending, sitting, reach over head, and caring for others   PARTICIPATION LIMITATIONS: meal prep, cleaning, laundry, driving, and occupation   PERSONAL FACTORS: Fitness, Past/current experiences, Profession, and Time since onset of injury/illness/exacerbation are also affecting patient's  functional outcome.    REHAB POTENTIAL: Good   CLINICAL DECISION MAKING: Evolving/moderate complexity   EVALUATION COMPLEXITY: Moderate     GOALS:   SHORT TERM GOALS: Target date: 08/06/22   Pt will be Ind in an initial HEP  Baseline: initiated Goal status: MET 08/23/22   2.  Pt will voice understanding of measures to assist in pain reduction  Baseline: initiated Goal status: MET 08/23/22   LONG TERM GOALS: Target date: 09/03/22   Pt will be Ind in a final HEP to maintain achieved LOF  Baseline: initiated Goal status: IN PROGRESS   2.  Increase cervical ROM by 10d or greater for improved function and as indication of decreased pain. Baseline: see flow  Goal status: IN PROGRESS   3.  Pt will demonstrate proper sitting posture to reduce cervical strain Baseline: initiated Goal status: IN PROGRESS   4.  Pt's NDI will improve to 15/50 30%  Baseline: 25/50 50% for low- moderate disability Goal status: IN PROGRESS   5.  Pt will report a decrease in pain to 4/10 or less for improved cervical function and QOL Baseline: 0-10/10; 6/60 on eval Goal status: IN PROGRESS     PLAN:   PT FREQUENCY: 2x/week   PT DURATION: 6 weeks   PLANNED INTERVENTIONS: Therapeutic exercises, Therapeutic activity, Patient/Family education, Self Care, Aquatic Therapy, Dry Needling, Electrical stimulation, Spinal manipulation, Spinal mobilization, Cryotherapy, Moist heat, Taping, Traction, Ultrasound, Ionotophoresis 35m/ml Dexamethasone, Manual therapy, and Re-evaluation   PLAN FOR NEXT SESSION: ***   assess response to session and update HEP as indicated; progress therex as indicated; use of modalities, manual therapy; and TPDN as indicated.    Custer Pimenta Romero-Perozo, Student-PT 08/26/2022, 11:15 AM

## 2022-08-27 ENCOUNTER — Encounter: Payer: Medicaid Other | Admitting: Physical Therapy

## 2022-08-30 ENCOUNTER — Ambulatory Visit: Payer: Medicaid Other | Admitting: Physical Therapy

## 2022-08-30 NOTE — Therapy (Incomplete)
OUTPATIENT PHYSICAL THERAPY TREATMENT NOTE   Patient Name: Melanie Chapman MRN: [id number] DOB:March 24, 1961, 62 y.o., female Today's Date: 08/30/2022  END OF SESSION:      Past Medical History:  Diagnosis Date   Cervical radiculopathy 06/18/2022   Degenerative arthritis of right shoulder region 12/20/2019   No past surgical history on file. Patient Active Problem List   Diagnosis Date Noted   Degenerative arthritis of right shoulder region 07/01/2022   Cervical radiculopathy 06/18/2022   Uterine fibroid 06/18/2022   Encounter for preventative adult health care examination 06/14/2022   Prediabetes 06/14/2022   Hyperlipidemia 06/14/2022   Primary osteoarthritis of left knee 06/05/2014   PCP: Iona Beard, MD   REFERRING PROVIDER: Velna Ochs, MD  REFERRING DIAG: [phone] (ICD-10-CM) - Cervical radiculopathy  THERAPY DIAG:  No diagnosis found.  RATIONALE FOR EVALUATION AND TREATMENT: Rehabilitation  PRECAUTIONS: none  NEXT MD VISIT: 09/02/22    SUBJECTIVE:                                                                                                                                                                                      SUBJECTIVE STATEMENT: ***   Had steroid injection on 2/7 in cervical neck which has been helping, has had a lot of N/T bilat hands and has been going on for two weeks. Has now gotten an MRI on the neck    PAIN:  Are you having pain? Yes: NPRS scale: 5/10 Pain location: Pt reports lower cervical and upper shoulder and back, L greater than R Pain description: ache Aggravating factors: office computer work, cooking, hair hygiene Relieving factors: Heat, TENS, resting Pain range on eval 0-10/10  PERTINENT HISTORY:  DJD R shoulder   OBJECTIVE: (objective measures completed at initial evaluation unless otherwise dated)  DIAGNOSTIC FINDINGS:  Xray cervical completed yesterday   X ray R shoulder 07/17/22 IMPRESSION: 1.  Punctate calcification regional to the expected location of the insertional fibers of the rotator cuff could represent the sequela remote avulsive injury versus the sequela of calcific tendinitis/hydroxyapatite deposition disease. Clinical correlation is advised. 2. Otherwise, no explanation for patient's right shoulder pain.   PATIENT SURVEYS:  NDI 25/50  50% severe disability   COGNITION: Overall cognitive status: Within functional limits for tasks assessed   SENSATION: WFL   POSTURE: rounded shoulders and forward head c CT step off   PALPATION: TTP to bilat cervical paraspinals, upper trap, and interscapular muscles         CERVICAL ROM:    Active ROM A/PROM (deg) eval PROM 07/30/22  Flexion 31 pulling pain   Extension 30 ache, throb provoked pain   Right lateral flexion 18  provoked N/T R UE >/= 25  Left lateral flexion 19 ache, throb provoked pain >/= 25  Right rotation 20 no issue 45  Left rotation 20 ache, throb provoked pain 45   (Blank rows = not tested)   UPPER EXTREMITY ROM: Equal bilat, decreased R elevation due to arthritis Active ROM Right eval Left eval  Shoulder flexion      Shoulder extension      Shoulder abduction      Shoulder adduction      Shoulder extension      Shoulder internal rotation      Shoulder external rotation      Elbow flexion      Elbow extension      Wrist flexion      Wrist extension      Wrist ulnar deviation      Wrist radial deviation      Wrist pronation      Wrist supination       (Blank rows = not tested)   UPPER EXTREMITY MMT: Myotome negative and grossly 4+/5 or greater MMT Right eval Left eval  Shoulder flexion      Shoulder extension      Shoulder abduction      Shoulder adduction      Shoulder extension      Shoulder internal rotation      Shoulder external rotation      Middle trapezius      Lower trapezius      Elbow flexion      Elbow extension      Wrist flexion      Wrist extension      Wrist  ulnar deviation      Wrist radial deviation      Wrist pronation      Wrist supination      Grip strength       (Blank rows = not tested)   CERVICAL SPECIAL TESTS:  Neck flexor muscle endurance test: Positive, Spurling's test: Positive, and Distraction test: Positive DNF endurance 1 sec, limited by pain   FUNCTIONAL TESTS:  NT   TODAY'S TREATMENT:    08/30/22 THERAPEUTIC EXERCISE: to improve flexibility, strength and mobility.  Verbal and tactile cues throughout for technique. UBE L2 3' forward 3' backward   Green Ball at wall with shoulder 90 flexion: Up/Down, S/S, CC/CW x20 each  Prone I's,Y's, T's on GSB x10 each   Median Nerve glides Seated Horizontal Abd RTB 2x10 Seated Diagonals RTB 2x10 Standing Lat Pulldowns 2x10 RTB  Open Book x10 5" hold bilat  Serratus 3-way Taps RTB x10 each  Serratus Roll with towel x10-caused some increase N/T in bilat hands so had to take frequent breaks    08/23/22 THERAPEUTIC EXERCISE: to improve flexibility, strength and mobility.  Verbal and tactile cues throughout for technique. UBE L2 3' forward 3' backward  UT Stx 2x30" LS Stx 2x30"  Median Nerve Glides x10 bilat Supine Chin Tuck x10 x10" Standing Rows & Low Rows RTB 2x10  Seated ER RTB 2x10 Seated Horizontal Abd RTB 2x10 Seated Diagonals RTB 2x10 Standing Lat Pulldowns 2x10 RTB  Open Book x10 5" hold bilat  Serratus 3-way Taps RTB x10 each  Serratus Roll with towel x10-caused some increase N/T in bilat hands so had to take frequent breaks  MANUAL THERAPY: To promote normalized muscle tension, improved joint mobility, increased ROM, and reduced pain. manual cervical distraction, sub occipital release   [facility] Adult PT Treatment:  DATE: 07/30/22 Therapeutic Exercise: UBE L2 3 min each  Pulleys 2 min flex and scaption Row Green band  Seated upper trap and levator stretches  Open books - unable to tolerate left sidelying due to  shoulder pain, shifted to LTR instead Supine chest press with dowel Supine protraction with dowel  Supine red band ER bilat x 15  Supine red band horizontal abduction x 10  Supine red band diagonals - alternating Manual Therapy: STW to upper traps bilat, cervical paraspinals, manual cervical distraction, sub occipital release Of note- pt has increased left anterior shoulder pain with supine eccentric DNF Modalities: HMP x 15 minutes , cervical   [facility] Adult PT Treatment:                                                DATE: 07/27/22 Therapeutic Exercise: Seated upper trap stretch Seated cervical rotation Seated levator stretch Seated scap squeezes  Seated shoulder rolls.  Seated chin tuck Supine chin tuck Supine chest press with chin tuck Supine pullovers with chin tuck  Supine yellow band ER bilat x 15  Supine yellow band horizontal abduction x 10  Manual Therapy: STW to upper traps bilat, cervical paraspinals, manual cervical distraction, sub occipital release Modalities: HMP x 15 minutes , cervical    PATIENT EDUCATION:  Education details: POC, HEP update Person educated: Patient Education method: Explanation, Demonstration, Tactile cues, Verbal cues, and Handouts Education comprehension: verbalized understanding, returned demonstration, verbal cues required, and tactile cues required   HOME EXERCISE PROGRAM: Access Code: [email or web address] URL: [email or web address] Date: 08/23/2022 Prepared by: Verdene Lennert Romero-Perozo  Exercises - Supine Cervical Retraction with Towel  - 3 x daily - 7 x weekly - 1 sets - 10 reps - 3 hold - Standing Cervical Retraction  - 1 x daily - 7 x weekly - 1 sets - 3-5 reps - 3 hold - Seated Cervical Sidebending AROM  - 6 x daily - 7 x weekly - 1 sets - 3 reps - 10 hold - Seated Cervical Rotation AROM (Mirrored)  - 6 x daily - 7 x weekly - 1 sets - 3 reps - 10 hold - Supine Shoulder Horizontal Abduction with Resistance  - 1 x daily - 3-4 x  weekly - 2 sets - 10 reps - Alternating star pattern  - 1 x daily - 3-4 x weekly - 2 sets - 10 reps - Supine shoulder external rotation- KNEES BENT  - 1 x daily - 3-4 x weekly - 2 sets - 10 reps - Median Nerve Flossing  - 1 x daily - 7 x weekly - 2 sets - 10 reps - Standing Shoulder Row with Anchored Resistance  - 1 x daily - 3-4 x weekly - 2 sets - 10 reps - Shoulder extension with resistance - Neutral  - 1 x daily - 3-4 x weekly - 2 sets - 10 reps - Sidelying Thoracic Rotation with Open Book  - 1 x daily - 7 x weekly - 2 sets - 10 reps - 5 seconds hold    ASSESSMENT:   CLINICAL IMPRESSION: ***   Pt came into today's session after being out for almost a month. Pt states she received a steroid injection on 2/7 and has been feeling a lot better since then. Pt states that she is able to perform her HEP without a problem and notes that  the main concern at the moment is her N/T in her hands. Today's session focused on reviewing previous exercises to make sure pt had no questions or concerns and pt responded well to them. Pt received a median nerve glide exercise to try and target her N/T and this seemed to ease her sx after a few repetitions. Cervical distraction and suboccipital release was conducted to decrease muscle tension and the radicular sx which the pt has stated has benefited in the past. Pt continues to benefit from skilled PT interventions to address her radicular sx while decreasing her pain with daily activities.   OBJECTIVE IMPAIRMENTS: decreased ROM, decreased strength, increased muscle spasms, impaired UE functional use, postural dysfunction, and pain.    ACTIVITY LIMITATIONS: carrying, lifting, bending, sitting, reach over head, and caring for others   PARTICIPATION LIMITATIONS: meal prep, cleaning, laundry, driving, and occupation   PERSONAL FACTORS: Fitness, Past/current experiences, Profession, and Time since onset of injury/illness/exacerbation are also affecting patient's  functional outcome.    REHAB POTENTIAL: Good   CLINICAL DECISION MAKING: Evolving/moderate complexity   EVALUATION COMPLEXITY: Moderate     GOALS:   SHORT TERM GOALS: Target date: 08/06/22   Pt will be Ind in an initial HEP  Baseline: initiated Goal status: MET 08/23/22   2.  Pt will voice understanding of measures to assist in pain reduction  Baseline: initiated Goal status: MET 08/23/22   LONG TERM GOALS: Target date: 09/03/22   Pt will be Ind in a final HEP to maintain achieved LOF  Baseline: initiated Goal status: IN PROGRESS   2.  Increase cervical ROM by 10d or greater for improved function and as indication of decreased pain. Baseline: see flow  Goal status: IN PROGRESS   3.  Pt will demonstrate proper sitting posture to reduce cervical strain Baseline: initiated Goal status: IN PROGRESS   4.  Pt's NDI will improve to 15/50 30%  Baseline: 25/50 50% for low- moderate disability Goal status: IN PROGRESS   5.  Pt will report a decrease in pain to 4/10 or less for improved cervical function and QOL Baseline: 0-10/10; 6/60 on eval Goal status: IN PROGRESS     PLAN:   PT FREQUENCY: 2x/week   PT DURATION: 6 weeks   PLANNED INTERVENTIONS: Therapeutic exercises, Therapeutic activity, Patient/Family education, Self Care, Aquatic Therapy, Dry Needling, Electrical stimulation, Spinal manipulation, Spinal mobilization, Cryotherapy, Moist heat, Taping, Traction, Ultrasound, Ionotophoresis '4mg'$ /ml Dexamethasone, Manual therapy, and Re-evaluation   PLAN FOR NEXT SESSION: ***   assess response to session and update HEP as indicated; progress therex as indicated; use of modalities, manual therapy; and TPDN as indicated.    Abubakar Crispo Romero-Perozo, Student-PT 08/30/2022, 11:50 AM

## 2022-09-01 ENCOUNTER — Ambulatory Visit: Payer: Medicaid Other | Admitting: Physical Therapy

## 2022-09-02 ENCOUNTER — Ambulatory Visit: Payer: Medicaid Other | Admitting: Physical Therapy

## 2022-09-02 ENCOUNTER — Encounter: Payer: Self-pay | Admitting: Physical Therapy

## 2022-09-02 DIAGNOSIS — M5412 Radiculopathy, cervical region: Secondary | ICD-10-CM | POA: Diagnosis not present

## 2022-09-02 DIAGNOSIS — R252 Cramp and spasm: Secondary | ICD-10-CM

## 2022-09-02 DIAGNOSIS — R293 Abnormal posture: Secondary | ICD-10-CM

## 2022-09-02 NOTE — Therapy (Signed)
OUTPATIENT PHYSICAL THERAPY TREATMENT / RE-CERTIFICATION  Progress Note  Reporting Period 07/21/2022 to 09/02/2022   See note below for Objective Data and Assessment of Progress/Goals.      Patient Name: Melanie Chapman MRN: [id number] DOB:04-16-1961, 62 y.o., female Today's Date: 09/02/2022  END OF SESSION:   PT End of Session - 09/02/22 1653     Visit Number 5    Number of Visits 13    Date for PT Re-Evaluation 10/14/22    Authorization Type [place] [employer]    Authorization Time Period 07/22/22-09/19/22    Authorization - Visit Number 4    Authorization - Number of Visits 6    PT Start Time [id number]    PT Stop Time 1747    PT Time Calculation (min) 49 min    Activity Tolerance Patient tolerated treatment well    Behavior During Therapy [facility] for tasks assessed/performed              Past Medical History:  Diagnosis Date   Cervical radiculopathy 06/18/2022   Degenerative arthritis of right shoulder region 12/20/2019   History reviewed. No pertinent surgical history. Patient Active Problem List   Diagnosis Date Noted   Degenerative arthritis of right shoulder region 07/01/2022   Cervical radiculopathy 06/18/2022   Uterine fibroid 06/18/2022   Encounter for preventative adult health care examination 06/14/2022   Prediabetes 06/14/2022   Hyperlipidemia 06/14/2022   Primary osteoarthritis of left knee 06/05/2014   PCP: Iona Beard, MD   REFERRING PROVIDER: Velna Ochs, MD  REFERRING DIAG: [phone] (ICD-10-CM) - Cervical radiculopathy  THERAPY DIAG:  Radiculopathy, cervical region  Cramp and spasm  Abnormal posture  RATIONALE FOR EVALUATION AND TREATMENT: Rehabilitation  PRECAUTIONS: none  NEXT MD VISIT: 09/30/22    SUBJECTIVE:                                                                                                                                                                                      SUBJECTIVE STATEMENT:  Pt reports  her pain has been well controlled - no pain except when having muscle spasms, but still having the intermittent numbness/tingling. She saw the MD today who started her on Lyrica and renewed her muscle relaxants.   PAIN:  Are you having pain? No   PERTINENT HISTORY:  DJD R shoulder   OBJECTIVE: (objective measures completed at initial evaluation unless otherwise dated)  DIAGNOSTIC FINDINGS:  Xray cervical completed yesterday   X ray R shoulder 07/17/22 IMPRESSION: 1. Punctate calcification regional to the expected location of the insertional fibers of the rotator cuff could represent the sequela remote avulsive injury versus the sequela of calcific tendinitis/hydroxyapatite  deposition disease. Clinical correlation is advised. 2. Otherwise, no explanation for patient's right shoulder pain.   PATIENT SURVEYS:  NDI 25/50  50% severe disability   COGNITION: Overall cognitive status: Within functional limits for tasks assessed   SENSATION: WFL   POSTURE: rounded shoulders and forward head c CT step off   PALPATION: TTP to bilat cervical paraspinals, upper trap, and interscapular muscles         CERVICAL ROM:    Active ROM A/PROM (deg) eval PROM 07/30/22 AROM  09/02/22  Flexion 31 pulling pain  46  Extension 30 ache, throb provoked pain  37 discomfort into R UE  Right lateral flexion 18 provoked N/T R UE >/= 25 21  Left lateral flexion 19 ache, throb provoked pain >/= 25 22  Right rotation 20 no issue 45 56  Left rotation 20 ache, throb provoked pain 45 51   (Blank rows = not tested)   UPPER EXTREMITY ROM: Eval - Equal bilat, decreased R elevation due to arthritis 09/02/22 - B UE WNL and symmetrical bilateral   UPPER EXTREMITY MMT: Eval - myotome negative and grossly 4+/5 or greater MMT Right 09/02/22 Left 09/02/22  Shoulder flexion  4 -  4 -  Shoulder extension 4+  4+   Shoulder abduction  4 * 4   Shoulder adduction      Shoulder internal rotation  4+  5  Shoulder  external rotation  4+  5  Middle trapezius      Lower trapezius      Elbow flexion      Elbow extension      Wrist flexion      Wrist extension      Wrist ulnar deviation      Wrist radial deviation      Wrist pronation      Wrist supination      Grip strength       (Blank rows = not tested, * = provoked pain and/or radicular symptoms)   CERVICAL SPECIAL TESTS:  Neck flexor muscle endurance test: Positive, Spurling's test: Positive, and Distraction test: Positive DNF endurance 1 sec, limited by pain   FUNCTIONAL TESTS:  NT   TODAY'S TREATMENT:    09/02/22 THERAPEUTIC EXERCISE: to improve flexibility, strength and mobility.  Verbal and tactile cues throughout for technique.  UBE - L2.5 x 6 min (3' each fwd & back) BATCA Lat pulldown 15# x 10 - cues for scapular retraction and depression maintaining scapular engagement upon release/raising of bar RTB Lat pulldown 10 x 5" Cervical rotation SNAGs with pillowcase x 10 bilaterally Cervical extension SNAGs with pillowcase x 10  THERAPEUTIC ACTIVITIES: Cervical and UE ROM assessment UE MMT Provided education on proper sitting posture and desk set-up for computer work both sitting and standing desk set-up. Goal assessment and update for recertification   [id number] THERAPEUTIC EXERCISE: to improve flexibility, strength and mobility.  Verbal and tactile cues throughout for technique. UBE L2 3' forward 3' backward  UT Stx 2x30" LS Stx 2x30"  Median Nerve Glides x10 bilat Supine Chin Tuck x10 x10" Standing Rows & Low Rows RTB 2x10  Seated ER RTB 2x10 Seated Horizontal Abd RTB 2x10 Seated Diagonals RTB 2x10 Standing Lat Pulldowns 2x10 RTB  Open Book x10 5" hold bilat  Serratus 3-way Taps RTB x10 each  Serratus Roll with towel x10-caused some increase N/T in bilat hands so had to take frequent breaks  MANUAL THERAPY: To promote normalized muscle tension, improved joint mobility, increased ROM, and  reduced pain. manual cervical  distraction, sub occipital release   [facility] Adult PT Treatment:                                                DATE: 07/30/22 Therapeutic Exercise: UBE L2 3 min each  Pulleys 2 min flex and scaption Row Green band  Seated upper trap and levator stretches  Open books - unable to tolerate left sidelying due to shoulder pain, shifted to LTR instead Supine chest press with dowel Supine protraction with dowel  Supine red band ER bilat x 15  Supine red band horizontal abduction x 10  Supine red band diagonals - alternating Manual Therapy: STW to upper traps bilat, cervical paraspinals, manual cervical distraction, sub occipital release Of note- pt has increased left anterior shoulder pain with supine eccentric DNF Modalities: HMP x 15 minutes , cervical    PATIENT EDUCATION:  Education details: progress with PT, ongoing PT POC, and HEP update  Person educated: Patient Education method: Explanation, Demonstration, Verbal cues, and Handouts Education comprehension: verbalized understanding and returned demonstration   HOME EXERCISE PROGRAM: Access Code: [email or web address] URL: [email or web address] Date: 09/02/2022 Prepared by: Annie Paras  Exercises - Supine Cervical Retraction with Towel  - 3 x daily - 7 x weekly - 1 sets - 10 reps - 3 hold - Standing Cervical Retraction  - 1 x daily - 7 x weekly - 1 sets - 3-5 reps - 3 hold - Seated Cervical Sidebending AROM  - 6 x daily - 7 x weekly - 1 sets - 3 reps - 10 hold - Seated Cervical Rotation AROM (Mirrored)  - 6 x daily - 7 x weekly - 1 sets - 3 reps - 10 hold - Supine Shoulder Horizontal Abduction with Resistance  - 1 x daily - 3-4 x weekly - 2 sets - 10 reps - Alternating star pattern  - 1 x daily - 3-4 x weekly - 2 sets - 10 reps - Supine shoulder external rotation- KNEES BENT  - 1 x daily - 3-4 x weekly - 2 sets - 10 reps - Median Nerve Flossing  - 1 x daily - 7 x weekly - 2 sets - 10 reps - Standing Shoulder Row with Anchored  Resistance  - 1 x daily - 3-4 x weekly - 2 sets - 10 reps - Shoulder extension with resistance - Neutral  - 1 x daily - 3-4 x weekly - 2 sets - 10 reps - Sidelying Thoracic Rotation with Open Book  - 1 x daily - 7 x weekly - 2 sets - 10 reps - 5 seconds hold - Standing Lat Pull Down with Resistance - Elbows Bent  - 1 x daily - 3-4 x weekly - 2 sets - 10 reps - 3 sec hold - Mid-Lower Cervical Extension SNAG with Strap  - 1 x daily - 7 x weekly - 2 sets - 10 reps - 3 sec hold - Seated Assisted Cervical Rotation with Towel  - 1 x daily - 7 x weekly - 2 sets - 10 reps - 3 sec hold  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  She reports medications updated with muscle relaxant renewed and new prescription for Lyrica received.  She states that she has had minimal pain since the ESI, but does still experience the radicular symptoms of numbness and tingling intermittently.  She continues to note limitation with household activities such as laundry and cooking with overhead reaching while hanging laundry reproducing her pain and radicular symptoms.  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  Education provided on proper workstation set up including positioning of keyboard and height of monitor as well as postural alignment in chair or when standing.  Cervical ROM has improved in flexion and bilateral rotation with greater restriction still noted in extension and bilateral lateral flexion.  She has full B UE ROM, however continued weakness noted predominantly in R>L shoulder flexion, with resisted abduction on R triggering her pain and radicular symptoms.  Cervical SNAGs introduced to promote improved cervical ROM, with HEP updated  accordingly.  Inetha inquired about using a Lat pulldown machine at gym, therefore guidance provided and proper use of machine as well as TB alternative for this exercise.  Ranita has met her NDI goal and partially met her ROM and pain goals, with her remaining goals still in progress.  She will continue to benefit from skilled PT to address her remaining deficits to improve mobility and activity tolerance with decreased pain interference, therefore will recommend recert for continued PT 1-2x/wk for up to 6 weeks.  OBJECTIVE IMPAIRMENTS: decreased ROM, decreased strength, increased muscle spasms, impaired UE functional use, postural dysfunction, and pain.    ACTIVITY LIMITATIONS: carrying, lifting, bending, sitting, reach over head, and caring for others   PARTICIPATION LIMITATIONS: meal prep, cleaning, laundry, driving, and occupation   PERSONAL FACTORS: Fitness, Past/current experiences, Profession, and Time since onset of injury/illness/exacerbation are also affecting patient's functional outcome.    REHAB POTENTIAL: Good   CLINICAL DECISION MAKING: Evolving/moderate complexity   EVALUATION COMPLEXITY: Moderate     GOALS:   SHORT TERM GOALS: Target date: 08/06/22   Pt will be Ind in an initial HEP  Baseline: initiated Goal status: MET 08/23/22   2.  Pt will voice understanding of measures to assist in pain reduction  Baseline: initiated Goal status: MET 08/23/22   LONG TERM GOALS: Target date: 09/03/22, extended to 10/14/2022   Pt will be Ind in a final HEP to maintain achieved LOF  Baseline: initiated      Goal status: IN PROGRESS  09/02/22 - Met for current HEP, however HEP updated today  2.  Increase cervical ROM by 10d or greater for improved function and as indication of decreased pain. Baseline: see cervical ROM table above Goal status: PARTIALLY MET  09/02/26 - cervical flexion and rotation increased by >10 degrees, but overall cervical range of motion still limited   3.  Pt  will demonstrate proper sitting posture to reduce cervical strain Baseline: initiated Goal status: IN PROGRESS   09/02/22 - education provided on proper posture and desk set up to reduce cervical strain   4.  Pt's NDI will improve to 15/50 30%  Baseline: 25/50 = 50% (severe disability) Goal status: MET  09/02/22 - 15 / 50 = 30.0 % (moderate disability)   5.  Pt will report a decrease in pain to 4/10 or less for improved cervical function and QOL Baseline: 0-10/10; 6/10 on eval Goal status: PARTIALLY MET   09/02/22 - current neck pain 0/10, but  ongoing intermittent numbness/tingling  6.  Patient will demonstrate improved B shoulder strength to >/= 4+/5 w/o pain provocation for functional UE use. Baseline: See MMT table above Goal status: INITIAL  7.  Pt will report ability to complete household chores such as hanging laundry or cooking w/o triggering her pain or UE radicular numbness/tingling. Baseline:  Goal status: INITIAL     PLAN:   PT FREQUENCY: 1-2x/week   PT DURATION: 6 weeks   PLANNED INTERVENTIONS: Therapeutic exercises, Therapeutic activity, Patient/Family education, Self Care, Aquatic Therapy, Dry Needling, Spinal manipulation, Spinal mobilization, Cryotherapy, Moist heat, Taping, Ultrasound, Manual therapy, and Re-evaluation   PLAN FOR NEXT SESSION: Review postural education as indicated; gently progress cervical ROM/flexibility; progress postural strengthening/stability; MT +/- as indicated.    Percival Spanish, PT 09/02/2022, 6:49 PM

## 2022-09-14 ENCOUNTER — Ambulatory Visit: Payer: Medicaid Other | Attending: Internal Medicine | Admitting: Physical Therapy

## 2022-09-14 ENCOUNTER — Encounter: Payer: Self-pay | Admitting: Physical Therapy

## 2022-09-14 DIAGNOSIS — M5412 Radiculopathy, cervical region: Secondary | ICD-10-CM | POA: Diagnosis present

## 2022-09-14 DIAGNOSIS — R293 Abnormal posture: Secondary | ICD-10-CM | POA: Insufficient documentation

## 2022-09-14 DIAGNOSIS — R252 Cramp and spasm: Secondary | ICD-10-CM | POA: Insufficient documentation

## 2022-09-14 NOTE — Therapy (Signed)
OUTPATIENT PHYSICAL THERAPY TREATMENT      Patient Name: Melanie Chapman MRN: [id number] DOB:April 28, 1961, 62 y.o., female Today's Date: 09/14/2022  END OF SESSION:   PT End of Session - 09/14/22 1620     Visit Number 6    Number of Visits 13    Date for PT Re-Evaluation 10/14/22    Authorization Type [place] [employer]    Authorization Time Period 07/22/22-09/19/22    Authorization - Visit Number 5    Authorization - Number of Visits 6    PT Start Time 1620    PT Stop Time 1708    PT Time Calculation (min) 48 min    Activity Tolerance Patient tolerated treatment well    Behavior During Therapy [facility] for tasks assessed/performed              Past Medical History:  Diagnosis Date   Cervical radiculopathy 06/18/2022   Degenerative arthritis of right shoulder region 12/20/2019   History reviewed. No pertinent surgical history. Patient Active Problem List   Diagnosis Date Noted   Degenerative arthritis of right shoulder region 07/01/2022   Cervical radiculopathy 06/18/2022   Uterine fibroid 06/18/2022   Encounter for preventative adult health care examination 06/14/2022   Prediabetes 06/14/2022   Hyperlipidemia 06/14/2022   Primary osteoarthritis of left knee 06/05/2014   PCP: Iona Beard, MD   REFERRING PROVIDER: Velna Ochs, MD  REFERRING DIAG: [phone] (ICD-10-CM) - Cervical radiculopathy  THERAPY DIAG:  Radiculopathy, cervical region  Cramp and spasm  Abnormal posture  RATIONALE FOR EVALUATION AND TREATMENT: Rehabilitation  PRECAUTIONS: none  NEXT MD VISIT: 09/15/22 & 09/30/22   SUBJECTIVE:                                                                                                                                                                                      SUBJECTIVE STATEMENT:  Pt reports she has a new appt with the MD tomorrow as she feels like her UE weakness is getting worse. She has made the recommended modifications to her  work station when standing and notes this is helping, but had difficulty in sitting as her chair won't elevate high enough. Now getting intermittent pins and needles in both UEs, but R still worse.  PAIN:  Are you having pain? Yes: NPRS scale:  3/10 Pain location: R side of neck,extending into R scapula and UE Pain description: spasm Aggravating factors: lifting overhead - hanging up clothes, breeze or shower water on her skin will trigger the pins & needles Relieving factors: changing position - letting her arms hang    PERTINENT HISTORY:  DJD R shoulder   OBJECTIVE: (objective measures  completed at initial evaluation unless otherwise dated)  DIAGNOSTIC FINDINGS:  Xray cervical completed yesterday   X ray R shoulder 07/17/22 IMPRESSION: 1. Punctate calcification regional to the expected location of the insertional fibers of the rotator cuff could represent the sequela remote avulsive injury versus the sequela of calcific tendinitis/hydroxyapatite deposition disease. Clinical correlation is advised. 2. Otherwise, no explanation for patient's right shoulder pain.   PATIENT SURVEYS:  NDI 25/50  50% severe disability   COGNITION: Overall cognitive status: Within functional limits for tasks assessed   SENSATION: WFL   POSTURE: rounded shoulders and forward head c CT step off   PALPATION: TTP to bilat cervical paraspinals, upper trap, and interscapular muscles         CERVICAL ROM:    Active ROM A/PROM (deg) eval PROM 07/30/22 AROM  09/02/22 AROM  09/14/22  Flexion 31 pulling pain  46 44  Extension 30 ache, throb provoked pain  37 discomfort into R UE 38 - triggers R UE N/T  Right lateral flexion 18 provoked N/T R UE >/= '25 21 29  '$ Left lateral flexion 19 ache, throb provoked pain >/= '25 22 23  '$ Right rotation 20 no issue 45 56 59  Left rotation 20 ache, throb provoked pain 45 51 48   (Blank rows = not tested)   UPPER EXTREMITY ROM: Eval - Equal bilat, decreased R  elevation due to arthritis 09/02/22 - B UE WNL and symmetrical bilateral   UPPER EXTREMITY MMT: Eval - myotome negative and grossly 4+/5 or greater MMT Right 09/02/22 Left 09/02/22 Right 09/14/22 Left 09/14/22  Shoulder flexion  4 -  4 - 4 4  Shoulder extension 4+  4+  4+ 4+  Shoulder abduction  4 * '4  4 4  '$ Shoulder adduction        Shoulder internal rotation  4+  '5 5 5  '$ Shoulder external rotation  4+  5 4+ 5  Middle trapezius        Lower trapezius        Elbow flexion        Elbow extension        Wrist flexion        Wrist extension        Wrist ulnar deviation        Wrist radial deviation        Wrist pronation        Wrist supination        Grip strength     30.67 32.33   (Blank rows = not tested, * = provoked pain and/or radicular symptoms)   CERVICAL SPECIAL TESTS:  Neck flexor muscle endurance test: Positive, Spurling's test: Positive, and Distraction test: Positive DNF endurance 1 sec, limited by pain   FUNCTIONAL TESTS:  NT   TODAY'S TREATMENT:    09/14/22 THERAPEUTIC EXERCISE: to improve flexibility, strength and mobility.  Verbal and tactile cues throughout for technique.  UBE - L3.0 x 6 min (3' each fwd & back) R brachial plexus nerve glide x 10  Prone from knees over green Pball - cervical and thoracic extension + I's, T's, Y's and W's x 10 each R/L UE PNF diagonals with red TB - D1/D2 flexion and extension x 10 each Standing open book stretch at wall 2 x 10 - 1st set with full horiz ABD arc, 2nd set via "bow and arrow" slide  THERAPEUTIC ACTIVITIES: Cervical ROM assessment UE MMT   09/02/22 THERAPEUTIC EXERCISE: to improve flexibility, strength and mobility.  Verbal and tactile cues throughout for technique.  UBE - L2.5 x 6 min (3' each fwd & back) BATCA Lat pulldown 15# x 10 - cues for scapular retraction and depression maintaining scapular engagement upon release/raising of bar RTB Lat pulldown 10 x 5" Cervical rotation SNAGs with pillowcase x 10  bilaterally Cervical extension SNAGs with pillowcase x 10  THERAPEUTIC ACTIVITIES: Cervical and UE ROM assessment UE MMT Provided education on proper sitting posture and desk set-up for computer work both sitting and standing desk set-up. Goal assessment and update for recertification   [id number] THERAPEUTIC EXERCISE: to improve flexibility, strength and mobility.  Verbal and tactile cues throughout for technique. UBE L2 3' forward 3' backward  UT Stx 2x30" LS Stx 2x30"  Median Nerve Glides x10 bilat Supine Chin Tuck x10 x10" Standing Rows & Low Rows RTB 2x10  Seated ER RTB 2x10 Seated Horizontal Abd RTB 2x10 Seated Diagonals RTB 2x10 Standing Lat Pulldowns 2x10 RTB  Open Book x10 5" hold bilat  Serratus 3-way Taps RTB x10 each  Serratus Roll with towel x10-caused some increase N/T in bilat hands so had to take frequent breaks  MANUAL THERAPY: To promote normalized muscle tension, improved joint mobility, increased ROM, and reduced pain. manual cervical distraction, sub occipital release    PATIENT EDUCATION:  Education details: progress with PT, ongoing PT POC, and HEP update  Person educated: Patient Education method: Explanation, Demonstration, Verbal cues, and Handouts Education comprehension: verbalized understanding and returned demonstration   HOME EXERCISE PROGRAM: Access Code: [email or web address] URL: [email or web address] Date: 09/14/2022 Prepared by: Annie Paras  Exercises - Supine Cervical Retraction with Towel  - 3 x daily - 7 x weekly - 1 sets - 10 reps - 3 hold - Standing Cervical Retraction  - 1 x daily - 7 x weekly - 1 sets - 3-5 reps - 3 hold - Seated Cervical Sidebending AROM  - 6 x daily - 7 x weekly - 1 sets - 3 reps - 10 hold - Seated Cervical Rotation AROM (Mirrored)  - 6 x daily - 7 x weekly - 1 sets - 3 reps - 10 hold - Supine Shoulder Horizontal Abduction with Resistance  - 1 x daily - 3-4 x weekly - 2 sets - 10 reps - Alternating star  pattern  - 1 x daily - 3-4 x weekly - 2 sets - 10 reps - Supine shoulder external rotation- KNEES BENT  - 1 x daily - 3-4 x weekly - 2 sets - 10 reps - Median Nerve Flossing  - 1 x daily - 7 x weekly - 2 sets - 10 reps - Standing Shoulder Row with Anchored Resistance  - 1 x daily - 3-4 x weekly - 2 sets - 10 reps - Shoulder extension with resistance - Neutral  - 1 x daily - 3-4 x weekly - 2 sets - 10 reps - Sidelying Thoracic Rotation with Open Book  - 1 x daily - 7 x weekly - 2 sets - 10 reps - 5 seconds hold - Standing Lat Pull Down with Resistance - Elbows Bent  - 1 x daily - 3-4 x weekly - 2 sets - 10 reps - 3 sec hold - Mid-Lower Cervical Extension SNAG with Strap  - 1 x daily - 7 x weekly - 2 sets - 10 reps - 3 sec hold - Seated Assisted Cervical Rotation with Towel  - 1 x daily - 7 x weekly - 2 sets - 10 reps - 3 sec hold  Patient Education - Air traffic controller for [employer] - Office Posture - Brachial plexus nerve glide    ASSESSMENT:   CLINICAL IMPRESSION:  Melanie Chapman reports more frequent incidences of the muscle spasms and radicular pins and needles now affecting B UE, but still R>L, hence she has an appointment to see the MD tomorrow. Introduced brachial plexus nerve glides which seemed to help resolve the pin and needles. Continued strengthening progression targeting gravity resisted postural and scapular strengthening as well as PNF patterns for UE's to mimic more functional movement patterns with good tolerance for new exercises. She notes current HEP still working well for her but remains limited in tolerance for laying on her R side, therefore provided alternatives for open book stretches in standing. Melanie Chapman will continue benefit from skilled PT to address ongoing pain/muscle spasm and sensory deficits/radicular symptoms as well as functional ROM and strength deficits to improve mobility and activity tolerance with decreased pain and radicular symptom interference.   OBJECTIVE IMPAIRMENTS:  decreased ROM, decreased strength, increased muscle spasms, impaired UE functional use, postural dysfunction, and pain.    ACTIVITY LIMITATIONS: carrying, lifting, bending, sitting, reach over head, and caring for others   PARTICIPATION LIMITATIONS: meal prep, cleaning, laundry, driving, and occupation   PERSONAL FACTORS: Fitness, Past/current experiences, Profession, and Time since onset of injury/illness/exacerbation are also affecting patient's functional outcome.    REHAB POTENTIAL: Good   CLINICAL DECISION MAKING: Evolving/moderate complexity   EVALUATION COMPLEXITY: Moderate     GOALS:   SHORT TERM GOALS: Target date: 08/06/22   Pt will be Ind in an initial HEP  Baseline: initiated Goal status: MET 08/23/22   2.  Pt will voice understanding of measures to assist in pain reduction  Baseline: initiated Goal status: MET 08/23/22   LONG TERM GOALS: Target date: 09/03/22, extended to 10/14/2022   Pt will be Ind in a final HEP to maintain achieved LOF  Baseline: initiated      Goal status: IN PROGRESS  09/14/22 - Met for current HEP, updates ongoing  2.  Increase cervical ROM by 10d or greater for improved function and as indication of decreased pain. Baseline: see cervical ROM table above Goal status: PARTIALLY MET  09/02/26 - cervical flexion and rotation increased by >10 degrees, but overall cervical range of motion still limited   3.  Pt will demonstrate proper sitting posture to reduce cervical strain Baseline: initiated Goal status: IN PROGRESS   09/14/22 - pt reports modifications to standing workstation have been helping but still has issues with sitting and driving/riding in a car   4.  Pt's NDI will improve to 15/50 30%  Baseline: 25/50 = 50% (severe disability) Goal status: MET  09/02/22 - 15 / 50 = 30.0 % (moderate disability)   5.  Pt will report a decrease in pain to 4/10 or less for improved cervical function and QOL Baseline: 0-10/10; 6/10 on eval Goal status:  PARTIALLY MET   09/14/22 - current neck pain 0/10, but ongoing intermittent numbness/tingling and pins in needles in R>L UE  6.  Patient will demonstrate improved B shoulder strength to >/= 4+/5 w/o pain provocation for functional UE use. Baseline: See MMT table above Goal status: IN PROGRESS  09/14/22  7.  Pt will report ability to complete household chores such as hanging laundry or cooking w/o triggering her pain or UE radicular numbness/tingling. Baseline:  Goal status: IN PROGRESS     PLAN:   PT FREQUENCY: 1-2x/week   PT DURATION: 6 weeks  PLANNED INTERVENTIONS: Therapeutic exercises, Therapeutic activity, Patient/Family education, Self Care, Aquatic Therapy, Dry Needling, Spinal manipulation, Spinal mobilization, Cryotherapy, Moist heat, Taping, Ultrasound, Manual therapy, and Re-evaluation   PLAN FOR NEXT SESSION: Review postural education as indicated; gently progress cervical ROM/flexibility; progress postural strengthening/stability; MT +/- as indicated.    Percival Spanish, PT 09/14/2022, 5:29 PM

## 2022-09-21 ENCOUNTER — Encounter: Payer: Self-pay | Admitting: Physical Therapy

## 2022-09-22 ENCOUNTER — Ambulatory Visit: Payer: Medicaid Other | Admitting: Physical Therapy

## 2022-09-22 ENCOUNTER — Encounter: Payer: Self-pay | Admitting: Physical Therapy

## 2022-09-22 DIAGNOSIS — R293 Abnormal posture: Secondary | ICD-10-CM

## 2022-09-22 DIAGNOSIS — M5412 Radiculopathy, cervical region: Secondary | ICD-10-CM

## 2022-09-22 DIAGNOSIS — R252 Cramp and spasm: Secondary | ICD-10-CM

## 2022-09-22 NOTE — Therapy (Addendum)
OUTPATIENT PHYSICAL THERAPY TREATMENT  / DISCHARGE SUMMARY     Patient Name: Melanie Chapman MRN: [id number] DOB:1961-04-04, 62 y.o., female Today's Date: 09/22/2022  END OF SESSION:   PT End of Session - 09/22/22 1145     Visit Number 7    Number of Visits 13    Date for PT Re-Evaluation 10/21/22    Authorization Type [place] Medicaid [employer]    Authorization Time Period 09/22/22 - 11/20/22    Authorization - Visit Number 1    Authorization - Number of Visits 5    PT Start Time 1145    PT Stop Time 1236    PT Time Calculation (min) 51 min    Activity Tolerance Patient tolerated treatment well    Behavior During Therapy [facility] for tasks assessed/performed              Past Medical History:  Diagnosis Date   Cervical radiculopathy 06/18/2022   Degenerative arthritis of right shoulder region 12/20/2019   History reviewed. No pertinent surgical history. Patient Active Problem List   Diagnosis Date Noted   Degenerative arthritis of right shoulder region 07/01/2022   Cervical radiculopathy 06/18/2022   Uterine fibroid 06/18/2022   Encounter for preventative adult health care examination 06/14/2022   Prediabetes 06/14/2022   Hyperlipidemia 06/14/2022   Primary osteoarthritis of left knee 06/05/2014   PCP: Quincy Simmonds, MD   REFERRING PROVIDER: Reymundo Poll, MD  REFERRING DIAG: [phone] (ICD-10-CM) - Cervical radiculopathy  THERAPY DIAG:  Radiculopathy, cervical region  Cramp and spasm  Abnormal posture  RATIONALE FOR EVALUATION AND TREATMENT: Rehabilitation  PRECAUTIONS: none  NEXT MD VISIT: 09/15/22 & 09/30/22   SUBJECTIVE:                                                                                                                                                                                      SUBJECTIVE STATEMENT:  Pt reports she has a severe headache this morning with nausea earlier but now resolved - requesting to focus on this today.  PAIN:   Are you having pain? Yes: NPRS scale:  8/10 Pain location: B neck & upper shoulders, headache Pain description: spasm, headache    PERTINENT HISTORY:  DJD R shoulder   OBJECTIVE: (objective measures completed at initial evaluation unless otherwise dated)  DIAGNOSTIC FINDINGS:  Xray cervical completed yesterday   X ray R shoulder 07/17/22 IMPRESSION: 1. Punctate calcification regional to the expected location of the insertional fibers of the rotator cuff could represent the sequela remote avulsive injury versus the sequela of calcific tendinitis/hydroxyapatite deposition disease. Clinical correlation is advised. 2. Otherwise, no explanation for patient's right shoulder pain.  PATIENT SURVEYS:  NDI 25/50  50% severe disability   COGNITION: Overall cognitive status: Within functional limits for tasks assessed   SENSATION: WFL   POSTURE: rounded shoulders and forward head c CT step off   PALPATION: TTP to bilat cervical paraspinals, upper trap, and interscapular muscles         CERVICAL ROM:    Active ROM A/PROM (deg) eval PROM 07/30/22 AROM  09/02/22 AROM  09/14/22  Flexion 31 pulling pain  46 44  Extension 30 ache, throb provoked pain  37 discomfort into R UE 38 - triggers R UE N/T  Right lateral flexion 18 provoked N/T R UE >/= 25 21 29   Left lateral flexion 19 ache, throb provoked pain >/= 25 22 23   Right rotation 20 no issue 45 56 59  Left rotation 20 ache, throb provoked pain 45 51 48   (Blank rows = not tested)   UPPER EXTREMITY ROM: Eval - Equal bilat, decreased R elevation due to arthritis 09/02/22 - B UE WNL and symmetrical bilateral   UPPER EXTREMITY MMT: Eval - myotome negative and grossly 4+/5 or greater MMT Right 09/02/22 Left 09/02/22 Right 09/14/22 Left 09/14/22  Shoulder flexion  4 -  4 - 4 4  Shoulder extension 4+  4+  4+ 4+  Shoulder abduction  4 * 4  4 4   Shoulder adduction        Shoulder internal rotation  4+  5 5 5   Shoulder external  rotation  4+  5 4+ 5  Middle trapezius        Lower trapezius        Elbow flexion        Elbow extension        Wrist flexion        Wrist extension        Wrist ulnar deviation        Wrist radial deviation        Wrist pronation        Wrist supination        Grip strength     30.67 32.33   (Blank rows = not tested, * = provoked pain and/or radicular symptoms)   CERVICAL SPECIAL TESTS:  Neck flexor muscle endurance test: Positive, Spurling's test: Positive, and Distraction test: Positive DNF endurance 1 sec, limited by pain   FUNCTIONAL TESTS:  NT   TODAY'S TREATMENT:    09/22/22 THERAPEUTIC EXERCISE: to improve flexibility, strength and mobility.  Verbal and tactile cues throughout for technique.  UBE - L3.0 x 6 min (3' each fwd & back)  MANUAL THERAPY: To promote normalized muscle tension, improved flexibility, improved joint mobility, increased ROM, and reduced pain. Skilled palpation and monitoring of soft tissue during DN Trigger Point Dry-Needling  Treatment instructions: Expect mild to moderate muscle soreness. S/S of pneumothorax if dry needled over a lung field, and to seek immediate medical attention should they occur. Patient verbalized understanding of these instructions and education. Patient Consent Given: Yes Education handout provided: Yes Muscles treated: B UT, LS, cervical paraspinals & suboccipitals  Electrical stimulation performed: No Parameters: N/A Treatment response/outcome: Twitch Response Elicited and Palpable Increase in Muscle Length STM/DTM, manual TPR and pin & stretch to muscles addressed with DN Manual suboccipital release Manual UT and LS stretches  MODALITIES: Moist heat to neck and upper shoulders x 10 min following MT & DN   09/14/22 THERAPEUTIC EXERCISE: to improve flexibility, strength and mobility.  Verbal and tactile cues throughout for  technique.  UBE - L3.0 x 6 min (3' each fwd & back) R brachial plexus nerve glide x 10   Prone from knees over green Pball - cervical and thoracic extension + I's, T's, Y's and W's x 10 each R/L UE PNF diagonals with red TB - D1/D2 flexion and extension x 10 each Standing open book stretch at wall 2 x 10 - 1st set with full horiz ABD arc, 2nd set via "bow and arrow" slide  THERAPEUTIC ACTIVITIES: Cervical ROM assessment UE MMT   09/02/22 THERAPEUTIC EXERCISE: to improve flexibility, strength and mobility.  Verbal and tactile cues throughout for technique.  UBE - L2.5 x 6 min (3' each fwd & back) BATCA Lat pulldown 15# x 10 - cues for scapular retraction and depression maintaining scapular engagement upon release/raising of bar RTB Lat pulldown 10 x 5" Cervical rotation SNAGs with pillowcase x 10 bilaterally Cervical extension SNAGs with pillowcase x 10  THERAPEUTIC ACTIVITIES: Cervical and UE ROM assessment UE MMT Provided education on proper sitting posture and desk set-up for computer work both sitting and standing desk set-up. Goal assessment and update for recertification     PATIENT EDUCATION:  Education details: role of DN and DN rational, procedure, outcomes, potential side effects, and recommended post-treatment exercises/activity  Person educated: Patient Education method: Explanation and Handouts Education comprehension: verbalized understanding   HOME EXERCISE PROGRAM: Access Code: [email or web address] URL: [email or web address] Date: 09/22/2022 Prepared by: Glenetta Hew  Exercises - Supine Cervical Retraction with Towel  - 3 x daily - 7 x weekly - 1 sets - 10 reps - 3 hold - Standing Cervical Retraction  - 1 x daily - 7 x weekly - 1 sets - 3-5 reps - 3 hold - Seated Cervical Sidebending AROM  - 6 x daily - 7 x weekly - 1 sets - 3 reps - 10 hold - Seated Cervical Rotation AROM (Mirrored)  - 6 x daily - 7 x weekly - 1 sets - 3 reps - 10 hold - Supine Shoulder Horizontal Abduction with Resistance  - 1 x daily - 3-4 x weekly - 2 sets - 10 reps -  Alternating star pattern  - 1 x daily - 3-4 x weekly - 2 sets - 10 reps - Supine shoulder external rotation- KNEES BENT  - 1 x daily - 3-4 x weekly - 2 sets - 10 reps - Median Nerve Flossing  - 1 x daily - 7 x weekly - 2 sets - 10 reps - Standing Shoulder Row with Anchored Resistance  - 1 x daily - 3-4 x weekly - 2 sets - 10 reps - Shoulder extension with resistance - Neutral  - 1 x daily - 3-4 x weekly - 2 sets - 10 reps - Sidelying Thoracic Rotation with Open Book  - 1 x daily - 7 x weekly - 2 sets - 10 reps - 5 seconds hold - Standing Lat Pull Down with Resistance - Elbows Bent  - 1 x daily - 3-4 x weekly - 2 sets - 10 reps - 3 sec hold - Mid-Lower Cervical Extension SNAG with Strap  - 1 x daily - 7 x weekly - 2 sets - 10 reps - 3 sec hold - Seated Assisted Cervical Rotation with Towel  - 1 x daily - 7 x weekly - 2 sets - 10 reps - 3 sec hold  Patient Education - Production designer, theatre/television/film for [employer] - Office Posture - Brachial plexus nerve glide - Trigger Point Dry Needling  ASSESSMENT:   CLINICAL IMPRESSION:  Brailynn arrives to PT with c/o severe headache this morning with pain/soreness in B neck and upper shoulders. Increased muscle tension evident t/o B UT, LS, cervical paraspinals and suboccipitals which appeared amenable to DN. After explanation of DN rational, procedures, outcomes and potential side effects, including precautions with DN over the lung fields, patient verbalized consent to DN treatment in conjunction with manual STM/DTM and TPR to reduce ttp/muscle tension. Muscles treated as indicated above. DN produced normal response with good twitches elicited resulting in palpable reduction in pain/ttp and muscle tension, with pt reporting headache nearly resolved. Pt educated to expect mild to moderate muscle soreness for up to 24-48 hrs and instructed to continue prescribed home exercise program and current activity level with pt verbalizing understanding of theses instructions. Session  concluded with moist heat to neck and upper shoulders to promote further muscle relaxation/relief from muscle spasm.   OBJECTIVE IMPAIRMENTS: decreased ROM, decreased strength, increased muscle spasms, impaired UE functional use, postural dysfunction, and pain.    ACTIVITY LIMITATIONS: carrying, lifting, bending, sitting, reach over head, and caring for others   PARTICIPATION LIMITATIONS: meal prep, cleaning, laundry, driving, and occupation   PERSONAL FACTORS: Fitness, Past/current experiences, Profession, and Time since onset of injury/illness/exacerbation are also affecting patient's functional outcome.    REHAB POTENTIAL: Good   CLINICAL DECISION MAKING: Evolving/moderate complexity   EVALUATION COMPLEXITY: Moderate     GOALS:   SHORT TERM GOALS: Target date: 08/06/22   Pt will be Ind in an initial HEP  Baseline: initiated Goal status: MET 08/23/22   2.  Pt will voice understanding of measures to assist in pain reduction  Baseline: initiated Goal status: MET 08/23/22   LONG TERM GOALS: Target date: 09/03/22, extended to 10/14/2022   Pt will be Ind in a final HEP to maintain achieved LOF  Baseline: initiated      Goal status: IN PROGRESS  09/14/22 - Met for current HEP, updates ongoing  2.  Increase cervical ROM by 10d or greater for improved function and as indication of decreased pain. Baseline: see cervical ROM table above Goal status: PARTIALLY MET  09/02/26 - cervical flexion and rotation increased by >10 degrees, but overall cervical range of motion still limited   3.  Pt will demonstrate proper sitting posture to reduce cervical strain Baseline: initiated Goal status: IN PROGRESS   09/14/22 - pt reports modifications to standing workstation have been helping but still has issues with sitting and driving/riding in a car   4.  Pt's NDI will improve to 15/50 30%  Baseline: 25/50 = 50% (severe disability) Goal status: MET  09/02/22 - 15 / 50 = 30.0 % (moderate disability)    5.  Pt will report a decrease in pain to 4/10 or less for improved cervical function and QOL Baseline: 0-10/10; 6/10 on eval Goal status: PARTIALLY MET   09/14/22 - current neck pain 0/10, but ongoing intermittent numbness/tingling and pins in needles in R>L UE  6.  Patient will demonstrate improved B shoulder strength to >/= 4+/5 w/o pain provocation for functional UE use. Baseline: See MMT table above Goal status: IN PROGRESS  09/14/22  7.  Pt will report ability to complete household chores such as hanging laundry or cooking w/o triggering her pain or UE radicular numbness/tingling. Baseline:  Goal status: IN PROGRESS     PLAN:   PT FREQUENCY: 1-2x/week   PT DURATION: 6 weeks   PLANNED INTERVENTIONS: Therapeutic exercises, Therapeutic activity, Patient/Family  education, Self Care, Aquatic Therapy, Dry Needling, Spinal manipulation, Spinal mobilization, Cryotherapy, Moist heat, Taping, Ultrasound, Manual therapy, and Re-evaluation   PLAN FOR NEXT SESSION: Assess response to DN; review postural education as indicated; gently progress cervical ROM/flexibility; progress postural strengthening/stability; MT +/- DN as indicated.    Marry Guan, PT 09/22/2022, 12:42 PM    PHYSICAL THERAPY DISCHARGE SUMMARY  Visits from Start of Care: 7  Current functional level related to goals / functional outcomes:   Refer to above clinical impression and goal assessment for status as of last visit on 09/22/2022. Patient canceled all remaining visits stating she was scheduled for surgery in April, therefore will proceed with discharge from PT for this episode.    Remaining deficits:   As above. Unable to formally assess status at discharge due to failure to return to PT.    Education / Equipment:   HEP   Patient agrees to discharge. Patient goals were partially met. Patient is being discharged due to the patient's request.  Marry Guan, PT 11/25/22, 12:17 PM  [facility] [address]  [address] [place], [place], [address] Phone: [phone]   Fax:  [phone]

## 2022-09-30 ENCOUNTER — Ambulatory Visit: Payer: Medicaid Other | Admitting: Physical Therapy

## 2022-10-07 ENCOUNTER — Ambulatory Visit: Payer: Medicaid Other | Admitting: Physical Therapy

## 2022-10-14 ENCOUNTER — Encounter: Payer: Medicaid Other | Admitting: Physical Therapy

## 2022-10-21 ENCOUNTER — Encounter: Payer: Medicaid Other | Admitting: Physical Therapy

## 2022-11-25 NOTE — Therapy (Signed)
OUTPATIENT PHYSICAL THERAPY CERVICAL EVALUATION   Patient Name: Melanie Chapman MRN: [id number] DOB:08/18/1960, 62 y.o., female Today's Date: 11/30/2022   END OF SESSION:  PT End of Session - 11/30/22 0847     Visit Number 1    Date for PT Re-Evaluation 01/25/23    Authorization Type [place] Medicaid Healthy Blue - VL: 27 (6 used)    PT Start Time 0847    PT Stop Time 0928    PT Time Calculation (min) 41 min    Activity Tolerance Patient tolerated treatment well    Behavior During Therapy [facility] for tasks assessed/performed             Past Medical History:  Diagnosis Date   Cervical radiculopathy 06/18/2022   Degenerative arthritis of right shoulder region 12/20/2019   History reviewed. No pertinent surgical history. Patient Active Problem List   Diagnosis Date Noted   Degenerative arthritis of right shoulder region 07/01/2022   Cervical radiculopathy 06/18/2022   Uterine fibroid 06/18/2022   Encounter for preventative adult health care examination 06/14/2022   Prediabetes 06/14/2022   Hyperlipidemia 06/14/2022   Primary osteoarthritis of left knee 06/05/2014    PCP: Quincy Simmonds, MD   REFERRING PROVIDER: Wynelle Link, MD  REFERRING DIAG: Z98.1 - History of spinal fusion  THERAPY DIAG:  Radiculopathy, cervical region  Abnormal posture  Muscle weakness (generalized)  Other muscle spasm  RATIONALE FOR EVALUATION AND TREATMENT: Rehabilitation  ONSET DATE: 10/14/22 - C5/6 ANTERIOR CERVICAL DISCECTOMY WITH FUSION   NEXT MD VISIT: 02/01/23   SUBJECTIVE:                                                                                                                                                                                                         SUBJECTIVE STATEMENT: Pt reports she ended up in the ER on Sat due to intense R sided pain and sensation of nerve block feeling R side of body - ED staff felt that some of the issues may have been from a RTC  injury.  Still having numbness in right index finger but will follow-up in surgeons office after PT eval today.  Otherwise recovery from surgery has been going well with relief of her pre-surgical pain and numbness.   PAIN: Are you having pain? Yes: NPRS scale: 5/10 Pain location: R sided neck, periscapular area and R UE to hand Pain description: aching Aggravating factors: "doing anything" - washing dishes, laundry, work related tasks Relieving factors: pain meds and muscle relaxants, TENS unit, heat, muscle or arthritis creams  PERTINENT HISTORY:  C5/6 ACDF 10/14/22; cervical radiculopathy; DJD R shoulder; L knee OA  PRECAUTIONS: Cervical - NO LIFTING more than 10 pounds. NO overhead reaching. NO twisting of the neck.   HAND DOMINANCE: Right  WEIGHT BEARING RESTRICTIONS: No  FALLS:  Has patient fallen in last 6 months? 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  2.  Moderate to severe bilateral foraminal stenosis at C6-C7.  3.  Mild central canal stenosis and moderate bilateral foraminal stenosis at C4-C5 and C5-C6.  4.  Small central disc protrusion at C3-C4 with mild central canal narrowing.  5.  Abutment of the ventral cord surface at the C3-C4 through C5-C6 levels.   PATIENT SURVEYS:  NDI 28 / 50 = 56.0 %  COGNITION: Overall cognitive status: Within functional limits for tasks  assessed  SENSATION: WFL Intermittent numbness in R index finger  POSTURE:  rounded shoulders and forward head  PALPATION: Increased muscle tension and TTP in R lateral cervicothoracic paraspinals, UT, LS, periscapular muscles, RTC, deltoids and pecs   CERVICAL ROM:   Active ROM eval  Flexion 30  Extension 37  Right lateral flexion 10  Left lateral flexion 12  Right rotation 33  Left rotation 38   (Blank rows = not tested)  UPPER EXTREMITY ROM: NT on eval due to overhead reaching limited by post-op precautions Active ROM    Shoulder flexion    Shoulder extension    Shoulder abduction    Shoulder adduction    Shoulder internal rotation    Shoulder external rotation    Elbow flexion    Elbow extension    Wrist flexion    Wrist extension    Wrist ulnar deviation    Wrist radial deviation    Wrist pronation    Wrist supination     (Blank rows = not tested)  UPPER EXTREMITY MMT:  MMT Right eval Left eval  Shoulder flexion 4 4+  Shoulder extension 4 5  Shoulder abduction 4 4+  Shoulder adduction    Shoulder internal rotation 4 4+  Shoulder external rotation 4- 4  Middle trapezius 4- 4  Lower trapezius 3+ 4-  Elbow flexion    Elbow extension    Wrist flexion    Wrist extension    Wrist ulnar deviation    Wrist radial deviation    Wrist pronation    Wrist supination    Grip strength     (Blank rows = not tested)    TODAY'S TREATMENT:   11/30/22 THERAPEUTIC EXERCISE: to improve flexibility, strength and mobility.  Verbal and tactile cues throughout for technique.  Seated cervical retraction 10 x 5" Seated scapular resetting/retraction 10 x 5"   PATIENT EDUCATION:  Education details: PT eval findings, anticipated POC, initial HEP, and postural awareness  Person educated: Patient Education method: Explanation, Demonstration, and MedBridgeGO app link texted to patient Education comprehension: verbalized understanding, returned demonstration, and  needs further education  HOME EXERCISE PROGRAM: *Access Code:  [email or web address] URL: [email or web address] Date: 11/30/2022 Prepared by: Glenetta Hew  Exercises - Seated Cervical Retraction  - 2-3 x daily - 7 x weekly - 2 sets - 10 reps - 3-5 sec hold - Seated Scapular Resetting  - 2-3 x daily - 7 x weekly - 2 sets - 10 reps - 3 sec hold  *MedBridgeGO access code texted to patient  ASSESSMENT:  CLINICAL IMPRESSION: Blaise Knaus is a 62 y.o. female who was seen today for physical therapy evaluation and treatment s/p C5/6 ACDF on 10/14/22.  Patient is no longer wearing cervical collar but still has restrictions per MD of NO LIFTING more than 10 pounds, NO overhead reaching and NO twisting of the neck.  She reports recovery was going well until onset of severe R-sided pain and radicular symptoms on Friday that led to her going to the ED on Saturday.  Per patient ED felt like some of her pain could be coming from her rotator cuff issue and she was given an injection which has given her some relief.  She will follow-up with her surgeon following the PT eval today.  Current deficits include abnormal posture; abnormal muscle tension in right cervicothoracic paraspinals, UT, LS, periscapular muscles and pecs; limited cervical ROM; along with postural and R>L upper extremity weakness.  HEP initiated with postural correction exercises.  Veronda will benefit from skilled PT to address above deficits to improve mobility and activity tolerance with decreased pain interference.   OBJECTIVE IMPAIRMENTS: decreased activity tolerance, decreased endurance, decreased knowledge of condition, decreased mobility, decreased ROM, decreased strength, hypomobility, increased fascial restrictions, impaired perceived functional ability, increased muscle spasms, impaired flexibility, impaired sensation, impaired UE functional use, improper body mechanics, postural dysfunction, and pain.   ACTIVITY LIMITATIONS: carrying,  lifting, bending, sitting, standing, bathing, dressing, reach over head, and hygiene/grooming  PARTICIPATION LIMITATIONS: meal prep, cleaning, laundry, driving, shopping, occupation, and yard work  PERSONAL FACTORS: Age, Fitness, Past/current experiences, Profession, Time since onset of injury/illness/exacerbation, and 1-2 comorbidities: DJD R shoulder/possible RTC injury and L knee OA  are also affecting patient's functional outcome.   REHAB POTENTIAL: Good  CLINICAL DECISION MAKING: Evolving/moderate complexity  EVALUATION COMPLEXITY: Moderate   GOALS: Goals reviewed with patient? Yes  SHORT TERM GOALS: Target date: 12/28/2022   Patient will be independent with initial HEP to improve outcomes and carryover.  Baseline: Initial HEP provided on eval Goal status: INITIAL  2.  Patient will notice centralization of pain in R UE with exercises and ADLs.  Baseline: R UE radicular pain and numbness Goal status: INITIAL  LONG TERM GOALS: Target date: 01/25/2023   Patient will be independent with ongoing/advanced HEP for self-management at home.  Baseline:  Goal status: INITIAL  2.  Patient will demonstrate improved posture to decrease muscle imbalance. Baseline: Forward head and rounded shoulder posture Goal status: INITIAL  3.  Patient will report 50-75% improvement in neck pain to improve QOL.  Baseline: 5/10 Goal status: INITIAL  4.  Patient will demonstrate functional pain free cervical ROM for safety with driving.  Baseline: Limited in all planes with twisting restricted per postop precautions Goal status: INITIAL  5. Patient will demonstrate improved B shoulder strength to >/= 4+/5 for functional UE use. Baseline: Refer to above MMT table Goal status: INITIAL  6.  Patient will report </= 41% on NDI to demonstrate improved functional ability.  Baseline: 28 / 50 = 56.0 % Goal status: INITIAL  7.  Patient to report ability to perform ADLs, household, and work-related  tasks without increased pain or triggering her UE radicular numbness/tingling.   Baseline:  Goal status: INITIAL     PLAN:  PT FREQUENCY: 2x/week  PT DURATION: 8 weeks  PLANNED INTERVENTIONS: Therapeutic exercises, Therapeutic activity, Neuromuscular re-education, Patient/Family education, Self Care, Joint mobilization, Dry Needling, Spinal mobilization, Taping, Ultrasound, Manual therapy, and Re-evaluation  PLAN FOR NEXT SESSION: review initial HEP; postural correction, stretching and strengthening; MT +/- DN as indicated   Marry Guan, PT 11/30/2022, 12:38 PM

## 2022-11-30 ENCOUNTER — Ambulatory Visit: Payer: Medicaid Other | Attending: Neurosurgery | Admitting: Physical Therapy

## 2022-11-30 ENCOUNTER — Other Ambulatory Visit: Payer: Self-pay

## 2022-11-30 ENCOUNTER — Encounter: Payer: Self-pay | Admitting: Physical Therapy

## 2022-11-30 DIAGNOSIS — R293 Abnormal posture: Secondary | ICD-10-CM | POA: Diagnosis present

## 2022-11-30 DIAGNOSIS — M62838 Other muscle spasm: Secondary | ICD-10-CM | POA: Diagnosis present

## 2022-11-30 DIAGNOSIS — M6281 Muscle weakness (generalized): Secondary | ICD-10-CM | POA: Insufficient documentation

## 2022-11-30 DIAGNOSIS — M5412 Radiculopathy, cervical region: Secondary | ICD-10-CM | POA: Insufficient documentation

## 2022-12-01 ENCOUNTER — Encounter: Payer: Self-pay | Admitting: *Deleted

## 2022-12-06 ENCOUNTER — Ambulatory Visit: Payer: Medicaid Other | Attending: Internal Medicine

## 2022-12-06 DIAGNOSIS — R293 Abnormal posture: Secondary | ICD-10-CM | POA: Insufficient documentation

## 2022-12-06 DIAGNOSIS — M5412 Radiculopathy, cervical region: Secondary | ICD-10-CM | POA: Diagnosis present

## 2022-12-06 DIAGNOSIS — R252 Cramp and spasm: Secondary | ICD-10-CM | POA: Insufficient documentation

## 2022-12-06 DIAGNOSIS — M62838 Other muscle spasm: Secondary | ICD-10-CM | POA: Diagnosis present

## 2022-12-06 DIAGNOSIS — M6281 Muscle weakness (generalized): Secondary | ICD-10-CM | POA: Insufficient documentation

## 2022-12-06 NOTE — Therapy (Signed)
OUTPATIENT PHYSICAL THERAPY CERVICAL TREATMENT   Patient Name: Melanie Chapman MRN: [id number] DOB:Aug 29, 1960, 62 y.o., female Today's Date: 12/06/2022   END OF SESSION:  PT End of Session - 12/06/22 0807     Visit Number 2    Date for PT Re-Evaluation 01/25/23    Authorization Type [place] Medicaid [employer] - VL: 27 (6 used)    Authorization Time Period 6/3-8/31    Authorization - Visit Number 1    Authorization - Number of Visits 12    PT Start Time 0804    PT Stop Time 0845    PT Time Calculation (min) 41 min    Activity Tolerance Patient tolerated treatment well    Behavior During Therapy [facility] for tasks assessed/performed             Past Medical History:  Diagnosis Date   Cervical radiculopathy 06/18/2022   Degenerative arthritis of right shoulder region 12/20/2019   History reviewed. No pertinent surgical history. Patient Active Problem List   Diagnosis Date Noted   Degenerative arthritis of right shoulder region 07/01/2022   Cervical radiculopathy 06/18/2022   Uterine fibroid 06/18/2022   Encounter for preventative adult health care examination 06/14/2022   Prediabetes 06/14/2022   Hyperlipidemia 06/14/2022   Primary osteoarthritis of left knee 06/05/2014    PCP: Quincy Simmonds, MD   REFERRING PROVIDER: Wynelle Link, MD  REFERRING DIAG: Z98.1 - History of spinal fusion  THERAPY DIAG:  Radiculopathy, cervical region  Abnormal posture  Muscle weakness (generalized)  Other muscle spasm  Cramp and spasm  RATIONALE FOR EVALUATION AND TREATMENT: Rehabilitation  ONSET DATE: 10/14/22 - C5/6 ANTERIOR CERVICAL DISCECTOMY WITH FUSION   NEXT MD VISIT: 02/01/23   SUBJECTIVE:                                                                                                                                                                                                         SUBJECTIVE STATEMENT: Doing pretty good today. Yesterday was a little rough  though.  PAIN: Are you having pain? No  PERTINENT HISTORY:  C5/6 ACDF 10/14/22; cervical radiculopathy; DJD R shoulder; L knee OA  PRECAUTIONS: Cervical - NO LIFTING more than 10 pounds. NO overhead reaching. NO twisting of the neck.   HAND DOMINANCE: Right  WEIGHT BEARING RESTRICTIONS: No  FALLS:  Has patient fallen in last 6 months? 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  2.  Moderate to severe bilateral foraminal stenosis at C6-C7.  3.  Mild central canal stenosis and moderate bilateral foraminal stenosis at C4-C5 and C5-C6.  4.  Small central disc protrusion at C3-C4 with mild central canal narrowing.  5.  Abutment of the ventral cord surface at the C3-C4 through C5-C6 levels.   PATIENT SURVEYS:  NDI 28 / 50 = 56.0 %  COGNITION: Overall cognitive status: Within functional limits for tasks assessed  SENSATION: WFL Intermittent numbness in R index finger  POSTURE:  rounded shoulders and forward head  PALPATION: Increased muscle tension and TTP in R lateral cervicothoracic paraspinals, UT, LS, periscapular muscles, RTC, deltoids and pecs   CERVICAL ROM:   Active ROM eval  Flexion 30  Extension 37  Right lateral flexion 10  Left lateral flexion 12  Right rotation 33  Left rotation 38   (Blank rows = not tested)  UPPER EXTREMITY ROM: NT on eval due to overhead reaching  limited by post-op precautions Active ROM    Shoulder flexion    Shoulder extension    Shoulder abduction    Shoulder adduction    Shoulder internal rotation    Shoulder external rotation    Elbow flexion    Elbow extension    Wrist flexion    Wrist extension    Wrist ulnar deviation    Wrist radial deviation    Wrist pronation    Wrist supination     (Blank rows = not tested)  UPPER EXTREMITY MMT:  MMT Right eval Left eval  Shoulder flexion 4 4+  Shoulder extension 4 5  Shoulder abduction 4 4+  Shoulder adduction    Shoulder internal rotation 4 4+  Shoulder external rotation 4- 4  Middle trapezius 4- 4  Lower trapezius 3+ 4-  Elbow flexion    Elbow extension    Wrist flexion    Wrist extension    Wrist ulnar deviation    Wrist radial deviation    Wrist pronation    Wrist supination    Grip strength     (Blank rows = not tested)    TODAY'S TREATMENT:  12/06/22 THERAPEUTIC EXERCISE: to improve flexibility, strength and mobility.  Verbal and tactile cues throughout for technique.  UBE L1.0 3 min fwd and back Standing in doorframe B ER red TB 2x10 Standing in doorframe B horizontal ABD red TB 2x10 Standing shoulder extension red TB x 10  Standing rows red TB x 10 Supine chin tucks 2x10  Supine shoulder press x 10 bil Supine chest stretch 3x10" Chest doorway stretch low x 30 sec  11/30/22 THERAPEUTIC EXERCISE: to improve flexibility, strength and mobility.  Verbal and tactile cues throughout for technique.  Seated cervical retraction 10 x 5" Seated scapular resetting/retraction 10 x 5"   PATIENT EDUCATION:  Education details: HEP update - rows, ext, ER   Person educated: Patient Education method: Explanation, Demonstration, and MedBridgeGO app link texted to patient Education comprehension: verbalized understanding, returned demonstration, and needs further education  HOME EXERCISE PROGRAM: Access Code: [email or web address] URL:  [email or web address] Date: 12/06/2022 Prepared by: Caryl Asp  Colden Samaras  Exercises - Seated Cervical Retraction  - 2-3 x daily - 7 x weekly - 2 sets - 10 reps - 3-5 sec hold - Seated Scapular Resetting  - 2-3 x daily - 7 x weekly - 2 sets - 10 reps - 3 sec hold - Shoulder External Rotation and Scapular Retraction with Resistance  - 1 x daily - 7 x weekly - 2 sets - 10 reps - Shoulder Extension with Resistance  - 1 x daily - 7 x weekly - 2 sets - 10 reps - Standing Bilateral Low Shoulder Row with Anchored Resistance  - 1 x daily - 7 x weekly - 2 sets - 10 reps - Doorway Pec Stretch at 60 Degrees Abduction with Arm Straight  - 2 x daily - 7 x weekly - 3 sets - 3 reps - 30 sec hold  *MedBridgeGO access code texted to patient  ASSESSMENT:  CLINICAL IMPRESSION: Pt denied pain on arrival today. Progressed postural exercises to tolerance, within surgical precautions. She showed a good demonstration of exercises with minimal cues required. Updated HEP with postural strengthening exercises, MedBridge code resent to patient. She would continue to benefit from skilled therapy  OBJECTIVE IMPAIRMENTS: decreased activity tolerance, decreased endurance, decreased knowledge of condition, decreased mobility, decreased ROM, decreased strength, hypomobility, increased fascial restrictions, impaired perceived functional ability, increased muscle spasms, impaired flexibility, impaired sensation, impaired UE functional use, improper body mechanics, postural dysfunction, and pain.   ACTIVITY LIMITATIONS: carrying, lifting, bending, sitting, standing, bathing, dressing, reach over head, and hygiene/grooming  PARTICIPATION LIMITATIONS: meal prep, cleaning, laundry, driving, shopping, occupation, and yard work  PERSONAL FACTORS: Age, Fitness, Past/current experiences, Profession, Time since onset of injury/illness/exacerbation, and 1-2 comorbidities: DJD R shoulder/possible RTC injury and L knee OA  are also  affecting patient's functional outcome.   REHAB POTENTIAL: Good  CLINICAL DECISION MAKING: Evolving/moderate complexity  EVALUATION COMPLEXITY: Moderate   GOALS: Goals reviewed with patient? Yes  SHORT TERM GOALS: Target date: 12/28/2022   Patient will be independent with initial HEP to improve outcomes and carryover.  Baseline: Initial HEP provided on eval Goal status: IN PROGRESS  2.  Patient will notice centralization of pain in R UE with exercises and ADLs.  Baseline: R UE radicular pain and numbness Goal status: IN PROGRESS  LONG TERM GOALS: Target date: 01/25/2023   Patient will be independent with ongoing/advanced HEP for self-management at home.  Baseline:  Goal status: IN PROGRESS  2.  Patient will demonstrate improved posture to decrease muscle imbalance. Baseline: Forward head and rounded shoulder posture Goal status: IN PROGRESS  3.  Patient will report 50-75% improvement in neck pain to improve QOL.  Baseline: 5/10 Goal status: IN PROGRESS  4.  Patient will demonstrate functional pain free cervical ROM for safety with driving.  Baseline: Limited in all planes with twisting restricted per postop precautions Goal status: IN PROGRESS  5. Patient will demonstrate improved B shoulder strength to >/= 4+/5 for functional UE use. Baseline: Refer to above MMT table Goal status: IN PROGRESS  6.  Patient will report </= 41% on NDI to demonstrate improved functional ability.  Baseline: 28 / 50 = 56.0 % Goal status: IN PROGRESS  7.  Patient to report ability to perform ADLs, household, and work-related tasks without increased pain or triggering her UE radicular numbness/tingling.   Baseline:  Goal status: IN PROGRESS     PLAN:  PT FREQUENCY: 2x/week  PT DURATION: 8 weeks  PLANNED INTERVENTIONS: Therapeutic exercises, Therapeutic activity, Neuromuscular  re-education, Patient/Family education, Self Care, Joint mobilization, Dry Needling, Spinal mobilization,  Taping, Ultrasound, Manual therapy, and Re-evaluation  PLAN FOR NEXT SESSION: postural correction/strengthening, stretching and strengthening; MT +/- DN as indicated   Darleene Cleaver, PTA 12/06/2022, 8:48 AM

## 2022-12-10 ENCOUNTER — Ambulatory Visit: Payer: Medicaid Other | Admitting: Physical Therapy

## 2022-12-10 ENCOUNTER — Encounter: Payer: Self-pay | Admitting: Physical Therapy

## 2022-12-10 DIAGNOSIS — M5412 Radiculopathy, cervical region: Secondary | ICD-10-CM | POA: Diagnosis not present

## 2022-12-10 DIAGNOSIS — M62838 Other muscle spasm: Secondary | ICD-10-CM

## 2022-12-10 DIAGNOSIS — R293 Abnormal posture: Secondary | ICD-10-CM

## 2022-12-10 DIAGNOSIS — M6281 Muscle weakness (generalized): Secondary | ICD-10-CM

## 2022-12-10 NOTE — Therapy (Signed)
OUTPATIENT PHYSICAL THERAPY TREATMENT   Patient Name: Melanie Chapman MRN: [id number] DOB:1960/11/21, 62 y.o., female Today's Date: 12/10/2022   END OF SESSION:  PT End of Session - 12/10/22 0844     Visit Number 3    Date for PT Re-Evaluation 01/25/23    Authorization Type [place] Medicaid Healthy Blue - VL: 27 (6 used)    Authorization Time Period 12/06/22 - 03/05/23    Authorization - Visit Number 2    Authorization - Number of Visits 12    PT Start Time 0844    PT Stop Time 0931    PT Time Calculation (min) 47 min    Activity Tolerance Patient tolerated treatment well    Behavior During Therapy [facility] for tasks assessed/performed              Past Medical History:  Diagnosis Date   Cervical radiculopathy 06/18/2022   Degenerative arthritis of right shoulder region 12/20/2019   History reviewed. No pertinent surgical history. Patient Active Problem List   Diagnosis Date Noted   Degenerative arthritis of right shoulder region 07/01/2022   Cervical radiculopathy 06/18/2022   Uterine fibroid 06/18/2022   Encounter for preventative adult health care examination 06/14/2022   Prediabetes 06/14/2022   Hyperlipidemia 06/14/2022   Primary osteoarthritis of left knee 06/05/2014    PCP: Quincy Simmonds, MD   REFERRING PROVIDER: Wynelle Link, MD  REFERRING DIAG: Z98.1 - History of spinal fusion  THERAPY DIAG:  Radiculopathy, cervical region  Abnormal posture  Muscle weakness (generalized)  Other muscle spasm  RATIONALE FOR EVALUATION AND TREATMENT: Rehabilitation  ONSET DATE: 10/14/22 - C5/6 ANTERIOR CERVICAL DISCECTOMY WITH FUSION   NEXT MD VISIT: 02/01/23   SUBJECTIVE:                                                                                                                                                                                                         SUBJECTIVE STATEMENT: Pt reports she has been having intermittent sharp pains since her NCV  testing this week.  PAIN: Are you having pain? Yes: NPRS scale: 2-3/10 Pain location: R lateral neck, upper shoulder & shoulder blade Pain description: aching & pulling, spasm-like  PERTINENT HISTORY:  C5/6 ACDF 10/14/22; cervical radiculopathy; DJD R shoulder; L knee OA  PRECAUTIONS: Cervical - NO LIFTING more than 10 pounds. NO overhead reaching. NO twisting of the neck.   HAND DOMINANCE: Right  WEIGHT BEARING RESTRICTIONS: No  FALLS:  Has patient fallen in last 6 months? 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  2.  Moderate to severe bilateral foraminal stenosis at C6-C7.  3.  Mild central canal stenosis and moderate bilateral foraminal stenosis at C4-C5 and C5-C6.  4.  Small central disc protrusion at C3-C4 with mild central canal narrowing.  5.  Abutment of the ventral cord surface at the C3-C4 through C5-C6 levels.   PATIENT SURVEYS:  NDI 28 / 50 = 56.0 %  COGNITION: Overall cognitive status: Within functional limits for tasks assessed  SENSATION: WFL Intermittent numbness in R index finger  POSTURE:  rounded shoulders and forward head  PALPATION: Increased muscle tension and TTP in R lateral cervicothoracic paraspinals, UT, LS, periscapular muscles, RTC, deltoids and pecs   CERVICAL ROM:   Active ROM eval  Flexion 30  Extension 37  Right lateral flexion 10  Left  lateral flexion 12  Right rotation 33  Left rotation 38   (Blank rows = not tested)  UPPER EXTREMITY ROM: NT on eval due to overhead reaching limited by post-op precautions Active ROM    Shoulder flexion    Shoulder extension    Shoulder abduction    Shoulder adduction    Shoulder internal rotation    Shoulder external rotation    Elbow flexion    Elbow extension    Wrist flexion    Wrist extension    Wrist ulnar deviation    Wrist radial deviation    Wrist pronation    Wrist supination     (Blank rows = not tested)  UPPER EXTREMITY MMT:  MMT Right eval Left eval  Shoulder flexion 4 4+  Shoulder extension 4 5  Shoulder abduction 4 4+  Shoulder adduction    Shoulder internal rotation 4 4+  Shoulder external rotation 4- 4  Middle trapezius 4- 4  Lower trapezius 3+ 4-  Elbow flexion    Elbow extension    Wrist flexion    Wrist extension    Wrist ulnar deviation    Wrist radial deviation    Wrist pronation    Wrist supination    Grip strength     (Blank rows = not tested)    TODAY'S TREATMENT:   12/10/22 THERAPEUTIC EXERCISE: to improve flexibility, strength and mobility.  Verbal and tactile cues throughout for technique. UBE - L2.0 x 6 min (3' each fwd & back) Standing GTB rows + scap retraction 10 x 3" Standing GTB scap retraction + shoulder extension to neutral 10 x 3" Hooklying pec stretch with hands behind head laying on pool noodle x 60" Hooklying on pool noodle: B scap retraction + shoulder ER 10 x 3" B scap retraction + shoulder horiz ABD 10 x 3"  MANUAL THERAPY: To promote normalized muscle tension, improved flexibility, and reduced pain.  STM/DTM, manual TPR and pin & stretch to R UT & LS B suboccipital release  R 1st rib mobs - grade II-III PA and inferior   12/06/22 THERAPEUTIC EXERCISE: to improve flexibility, strength and mobility.  Verbal and tactile  cues throughout for technique.  UBE L1.0 3 min fwd and back Standing in doorframe B ER red  TB 2x10 Standing in doorframe B horizontal ABD red TB 2x10 Standing shoulder extension red TB x 10  Standing rows red TB x 10 Supine chin tucks 2x10  Supine shoulder press x 10 bil Supine chest stretch 3x10" Chest doorway stretch low x 30 sec   11/30/22 THERAPEUTIC EXERCISE: to improve flexibility, strength and mobility.  Verbal and tactile cues throughout for technique.  Seated cervical retraction 10 x 5" Seated scapular resetting/retraction 10 x 5"   PATIENT EDUCATION:  Education details: HEP update - rows, ext, ER   Person educated: Patient Education method: Explanation, Demonstration, and MedBridgeGO app link texted to patient Education comprehension: verbalized understanding, returned demonstration, and needs further education  HOME EXERCISE PROGRAM: Access Code: [email or web address] URL: [email or web address] Date: 12/06/2022 Prepared by: Verta Ellen  Exercises - Seated Cervical Retraction  - 2-3 x daily - 7 x weekly - 2 sets - 10 reps - 3-5 sec hold - Seated Scapular Resetting  - 2-3 x daily - 7 x weekly - 2 sets - 10 reps - 3 sec hold - Shoulder External Rotation and Scapular Retraction with Resistance  - 1 x daily - 7 x weekly - 2 sets - 10 reps - Shoulder Extension with Resistance  - 1 x daily - 7 x weekly - 2 sets - 10 reps - Standing Bilateral Low Shoulder Row with Anchored Resistance  - 1 x daily - 7 x weekly - 2 sets - 10 reps - Doorway Pec Stretch at 60 Degrees Abduction with Arm Straight  - 2 x daily - 7 x weekly - 3 sets - 3 reps - 30 sec hold  *MedBridgeGO access code texted to patient  ASSESSMENT:  CLINICAL IMPRESSION: Jayden reports some increased pain in R neck, upper shoulder and scapula today and also notes some intermittent sharp pain since her NCV testing earlier this week. She was able to tolerate progression of resistance with standing rows and shoulder extension to GTB but cues necessary to avoid excessive posterior movement of elbows during rows  to prevent fwd rounding of shoulders. Introduced pool noodle with supine pec stretch and scapular strengthening to help open up chest and promote better scap retraction - pt liking the feel of the pool noodle and states she has one at home to use with her HEP. MT focused on tightness in R upper shoulder and neck with pt noting resolution of her pins and needles feeling following MT. Edell will continue to benefit from skilled PT to address her postural, ROM and strength deficits to improve mobility and activity tolerance with decreased pain interference.   OBJECTIVE IMPAIRMENTS: decreased activity tolerance, decreased endurance, decreased knowledge of condition, decreased mobility, decreased ROM, decreased strength, hypomobility, increased fascial restrictions, impaired perceived functional ability, increased muscle spasms, impaired flexibility, impaired sensation, impaired UE functional use, improper body mechanics, postural dysfunction, and pain.   ACTIVITY LIMITATIONS: carrying, lifting, bending, sitting, standing, bathing, dressing, reach over head, and hygiene/grooming  PARTICIPATION LIMITATIONS: meal prep, cleaning, laundry, driving, shopping, occupation, and yard work  PERSONAL FACTORS: Age, Fitness, Past/current experiences, Profession, Time since onset of injury/illness/exacerbation, and 1-2 comorbidities: DJD R shoulder/possible RTC injury and L knee OA  are also affecting patient's functional outcome.   REHAB POTENTIAL: Good  CLINICAL DECISION MAKING: Evolving/moderate complexity  EVALUATION COMPLEXITY: Moderate   GOALS: Goals reviewed with patient? Yes  SHORT TERM GOALS: Target date: 12/28/2022   Patient  will be independent with initial HEP to improve outcomes and carryover.  Baseline: Initial HEP provided on eval Goal status: IN PROGRESS  2.  Patient will notice centralization of pain in R UE with exercises and ADLs.  Baseline: R UE radicular pain and numbness Goal status: IN  PROGRESS  12/10/22 - Resolved after MT today.  Will assess for recurrence next visit.  LONG TERM GOALS: Target date: 01/25/2023   Patient will be independent with ongoing/advanced HEP for self-management at home.  Baseline:  Goal status: IN PROGRESS  2.  Patient will demonstrate improved posture to decrease muscle imbalance. Baseline: Forward head and rounded shoulder posture Goal status: IN PROGRESS  3.  Patient will report 50-75% improvement in neck pain to improve QOL.  Baseline: 5/10 Goal status: IN PROGRESS  4.  Patient will demonstrate functional pain free cervical ROM for safety with driving.  Baseline: Limited in all planes with twisting restricted per postop precautions Goal status: IN PROGRESS  5. Patient will demonstrate improved B shoulder strength to >/= 4+/5 for functional UE use. Baseline: Refer to above MMT table Goal status: IN PROGRESS  6.  Patient will report </= 41% on NDI to demonstrate improved functional ability.  Baseline: 28 / 50 = 56.0 % Goal status: IN PROGRESS  7.  Patient to report ability to perform ADLs, household, and work-related tasks without increased pain or triggering her UE radicular numbness/tingling.   Baseline:  Goal status: IN PROGRESS    PLAN:  PT FREQUENCY: 2x/week  PT DURATION: 8 weeks  PLANNED INTERVENTIONS: Therapeutic exercises, Therapeutic activity, Neuromuscular re-education, Patient/Family education, Self Care, Joint mobilization, Dry Needling, Spinal mobilization, Taping, Ultrasound, Manual therapy, and Re-evaluation  PLAN FOR NEXT SESSION: postural correction/strengthening, stretching and strengthening; MT +/- DN as indicated   Marry Guan, PT 12/10/2022, 12:37 PM

## 2022-12-15 ENCOUNTER — Ambulatory Visit: Payer: Medicaid Other | Admitting: Physical Therapy

## 2022-12-19 NOTE — Therapy (Signed)
OUTPATIENT PHYSICAL THERAPY TREATMENT   Patient Name: Melanie Chapman MRN: [id number] DOB:[birth year], 62 y.o., female Today's Date: 12/10/2022   END OF SESSION:  PT End of Session - 12/10/22 0844     Visit Number 3    Date for PT Re-Evaluation 01/25/23    Authorization Type [place] [employer] - VL: 27 (6 used)    Authorization Time Period 12/06/22 - 03/05/23    Authorization - Visit Number 2    Authorization - Number of Visits 12    PT Start Time 0844    PT Stop Time 0931    PT Time Calculation (min) 47 min    Activity Tolerance Patient tolerated treatment well    Behavior During Therapy [facility] for tasks assessed/performed              Past Medical History:  Diagnosis Date   Cervical radiculopathy 06/18/2022   Degenerative arthritis of right shoulder region 12/20/2019   History reviewed. No pertinent surgical history. Patient Active Problem List   Diagnosis Date Noted   Degenerative arthritis of right shoulder region 07/01/2022   Cervical radiculopathy 06/18/2022   Uterine fibroid 06/18/2022   Encounter for preventative adult health care examination 06/14/2022   Prediabetes 06/14/2022   Hyperlipidemia 06/14/2022   Primary osteoarthritis of left knee 06/05/2014    PCP: Quincy Simmonds, MD   REFERRING PROVIDER: Wynelle Link, MD  REFERRING DIAG: Z98.1 - History of spinal fusion  THERAPY DIAG:  Radiculopathy, cervical region  Abnormal posture  Muscle weakness (generalized)  Other muscle spasm  RATIONALE FOR EVALUATION AND TREATMENT: Rehabilitation  ONSET DATE: 10/14/22 - C5/6 ANTERIOR CERVICAL DISCECTOMY WITH FUSION   NEXT MD VISIT: 02/01/23   SUBJECTIVE:                                                                                                                                                                                                         SUBJECTIVE STATEMENT: ***  PAIN: Are you having pain? Yes: NPRS scale: 2-3/10 Pain location: R  lateral neck, upper shoulder & shoulder blade Pain description: aching & pulling, spasm-like  PERTINENT HISTORY:  C5/6 ACDF 10/14/22; cervical radiculopathy; DJD R shoulder; L knee OA  PRECAUTIONS: Cervical - NO LIFTING more than 10 pounds. NO overhead reaching. NO twisting of the neck.   HAND DOMINANCE: Right  WEIGHT BEARING RESTRICTIONS: No  FALLS:  Has patient fallen in last 6 months? 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  2.  Moderate to severe bilateral foraminal stenosis at C6-C7.  3.  Mild central canal stenosis and moderate bilateral foraminal stenosis at C4-C5 and C5-C6.  4.  Small central disc protrusion at C3-C4 with mild central canal narrowing.  5.  Abutment of the ventral cord surface at the C3-C4 through C5-C6 levels.   PATIENT SURVEYS:  NDI 28 / 50 = 56.0 %  COGNITION: Overall cognitive status: Within functional limits for tasks assessed  SENSATION: WFL Intermittent numbness in R index finger  POSTURE:  rounded shoulders and forward head  PALPATION: Increased muscle tension and TTP in R lateral cervicothoracic paraspinals, UT, LS, periscapular muscles, RTC, deltoids and pecs   CERVICAL ROM:   Active ROM eval  Flexion 30  Extension 37  Right lateral flexion 10  Left lateral flexion 12  Right rotation 33  Left rotation 38   (Blank rows = not tested)  UPPER  EXTREMITY ROM: NT on eval due to overhead reaching limited by post-op precautions Active ROM    Shoulder flexion    Shoulder extension    Shoulder abduction    Shoulder adduction    Shoulder internal rotation    Shoulder external rotation    Elbow flexion    Elbow extension    Wrist flexion    Wrist extension    Wrist ulnar deviation    Wrist radial deviation    Wrist pronation    Wrist supination     (Blank rows = not tested)  UPPER EXTREMITY MMT:  MMT Right eval Left eval  Shoulder flexion 4 4+  Shoulder extension 4 5  Shoulder abduction 4 4+  Shoulder adduction    Shoulder internal rotation 4 4+  Shoulder external rotation 4- 4  Middle trapezius 4- 4  Lower trapezius 3+ 4-  Elbow flexion    Elbow extension    Wrist flexion    Wrist extension    Wrist ulnar deviation    Wrist radial deviation    Wrist pronation    Wrist supination    Grip strength     (Blank rows = not tested)    TODAY'S TREATMENT:   12/20/22 THERAPEUTIC EXERCISE: to improve flexibility, strength and mobility.  Verbal and tactile cues throughout for technique. UBE - L2.0 x 6 min (3' each fwd & back) Standing GTB rows + scap retraction 10 x 3" Standing GTB scap retraction + shoulder extension to neutral 10 x 3" Hooklying pec stretch with hands behind head laying on pool noodle x 60" Hooklying on pool noodle: B scap retraction + shoulder ER 10 x 3" B scap retraction + shoulder horiz ABD 10 x 3"  MANUAL THERAPY: To promote normalized muscle tension, improved flexibility, and reduced pain.  STM/DTM, manual TPR and pin & stretch to R UT & LS B suboccipital release  R 1st rib mobs - grade II-III PA and inferior  12/10/22 THERAPEUTIC EXERCISE: to improve flexibility, strength and mobility.  Verbal and tactile cues throughout for technique. UBE - L2.0 x 6 min (3' each fwd & back)  Standing GTB rows + scap retraction 10 x 3" Standing GTB scap retraction + shoulder extension to neutral 10 x  3" Hooklying pec stretch with hands behind head laying on pool noodle x 60" Hooklying on pool noodle: B scap retraction + shoulder ER 10 x 3" B scap retraction + shoulder horiz ABD 10 x 3"  MANUAL THERAPY: To promote normalized muscle tension, improved flexibility, and reduced pain.  STM/DTM, manual TPR and pin & stretch to R UT & LS B suboccipital release  R 1st rib mobs - grade II-III PA and inferior   12/06/22 THERAPEUTIC EXERCISE: to improve flexibility, strength and mobility.  Verbal and tactile cues throughout for technique.  UBE L1.0 3 min fwd and back Standing in doorframe B ER red TB 2x10 Standing in doorframe B horizontal ABD red TB 2x10 Standing shoulder extension red TB x 10  Standing rows red TB x 10 Supine chin tucks 2x10  Supine shoulder press x 10 bil Supine chest stretch 3x10" Chest doorway stretch low x 30 sec   11/30/22 THERAPEUTIC EXERCISE: to improve flexibility, strength and mobility.  Verbal and tactile cues throughout for technique.  Seated cervical retraction 10 x 5" Seated scapular resetting/retraction 10 x 5"   PATIENT EDUCATION:  Education details: HEP update - rows, ext, ER   Person educated: Patient Education method: Explanation, Demonstration, and MedBridgeGO app link texted to patient Education comprehension: verbalized understanding, returned demonstration, and needs further education  HOME EXERCISE PROGRAM: Access Code: [email or web address] URL: [email or web address] Date: 12/06/2022 Prepared by: Verta Ellen  Exercises - Seated Cervical Retraction  - 2-3 x daily - 7 x weekly - 2 sets - 10 reps - 3-5 sec hold - Seated Scapular Resetting  - 2-3 x daily - 7 x weekly - 2 sets - 10 reps - 3 sec hold - Shoulder External Rotation and Scapular Retraction with Resistance  - 1 x daily - 7 x weekly - 2 sets - 10 reps - Shoulder Extension with Resistance  - 1 x daily - 7 x weekly - 2 sets - 10 reps - Standing Bilateral Low Shoulder Row with  Anchored Resistance  - 1 x daily - 7 x weekly - 2 sets - 10 reps - Doorway Pec Stretch at 60 Degrees Abduction with Arm Straight  - 2 x daily - 7 x weekly - 3 sets - 3 reps - 30 sec hold  *MedBridgeGO access code texted to patient  ASSESSMENT:  CLINICAL IMPRESSION: ***   OBJECTIVE IMPAIRMENTS: decreased activity tolerance, decreased endurance, decreased knowledge of condition, decreased mobility, decreased ROM, decreased strength, hypomobility, increased fascial restrictions, impaired perceived functional ability, increased muscle spasms, impaired flexibility, impaired sensation, impaired UE functional use, improper body mechanics, postural dysfunction, and pain.   ACTIVITY LIMITATIONS: carrying, lifting, bending, sitting, standing, bathing, dressing, reach over head, and hygiene/grooming  PARTICIPATION LIMITATIONS: meal prep, cleaning, laundry, driving, shopping, occupation, and yard work  PERSONAL FACTORS: Age, Fitness, Past/current experiences, Profession, Time since onset of injury/illness/exacerbation, and 1-2 comorbidities: DJD R shoulder/possible RTC injury and L knee OA  are also affecting patient's functional outcome.   REHAB POTENTIAL: Good  CLINICAL DECISION MAKING: Evolving/moderate complexity  EVALUATION COMPLEXITY: Moderate   GOALS: Goals reviewed with patient? Yes  SHORT TERM GOALS: Target date: 12/28/2022   Patient will be independent with initial HEP to improve outcomes and carryover.  Baseline: Initial HEP provided on eval Goal status: IN PROGRESS  2.  Patient will notice centralization of pain in R UE with exercises and  ADLs.  Baseline: R UE radicular pain and numbness Goal status: IN PROGRESS  12/10/22 - Resolved after MT today.  Will assess for recurrence next visit.  LONG TERM GOALS: Target date: 01/25/2023   Patient will be independent with ongoing/advanced HEP for self-management at home.  Baseline:  Goal status: IN PROGRESS  2.  Patient will  demonstrate improved posture to decrease muscle imbalance. Baseline: Forward head and rounded shoulder posture Goal status: IN PROGRESS  3.  Patient will report 50-75% improvement in neck pain to improve QOL.  Baseline: 5/10 Goal status: IN PROGRESS  4.  Patient will demonstrate functional pain free cervical ROM for safety with driving.  Baseline: Limited in all planes with twisting restricted per postop precautions Goal status: IN PROGRESS  5. Patient will demonstrate improved B shoulder strength to >/= 4+/5 for functional UE use. Baseline: Refer to above MMT table Goal status: IN PROGRESS  6.  Patient will report </= 41% on NDI to demonstrate improved functional ability.  Baseline: 28 / 50 = 56.0 % Goal status: IN PROGRESS  7.  Patient to report ability to perform ADLs, household, and work-related tasks without increased pain or triggering her UE radicular numbness/tingling.   Baseline:  Goal status: IN PROGRESS    PLAN:  PT FREQUENCY: 2x/week  PT DURATION: 8 weeks  PLANNED INTERVENTIONS: Therapeutic exercises, Therapeutic activity, Neuromuscular re-education, Patient/Family education, Self Care, Joint mobilization, Dry Needling, Spinal mobilization, Taping, Ultrasound, Manual therapy, and Re-evaluation  PLAN FOR NEXT SESSION: postural correction/strengthening, stretching and strengthening; MT +/- DN as indicated   Marry Guan, PT 12/10/2022, 12:37 PM

## 2022-12-20 ENCOUNTER — Encounter: Payer: Self-pay | Admitting: Physical Therapy

## 2022-12-20 ENCOUNTER — Ambulatory Visit: Payer: Medicaid Other | Admitting: Physical Therapy

## 2022-12-20 DIAGNOSIS — M5412 Radiculopathy, cervical region: Secondary | ICD-10-CM | POA: Diagnosis not present

## 2022-12-20 DIAGNOSIS — M62838 Other muscle spasm: Secondary | ICD-10-CM

## 2022-12-20 DIAGNOSIS — R252 Cramp and spasm: Secondary | ICD-10-CM

## 2022-12-20 DIAGNOSIS — M6281 Muscle weakness (generalized): Secondary | ICD-10-CM

## 2022-12-20 DIAGNOSIS — R293 Abnormal posture: Secondary | ICD-10-CM

## 2022-12-23 ENCOUNTER — Encounter: Payer: Medicaid Other | Admitting: Physical Therapy

## 2022-12-30 ENCOUNTER — Encounter: Payer: Medicaid Other | Admitting: Physical Therapy

## 2023-01-03 ENCOUNTER — Encounter: Payer: Medicaid Other | Admitting: Physical Therapy

## 2023-01-05 ENCOUNTER — Encounter: Payer: Self-pay | Admitting: Physical Therapy

## 2023-01-05 ENCOUNTER — Ambulatory Visit: Payer: Medicaid Other | Attending: Neurosurgery | Admitting: Physical Therapy

## 2023-01-05 DIAGNOSIS — R293 Abnormal posture: Secondary | ICD-10-CM | POA: Diagnosis present

## 2023-01-05 DIAGNOSIS — M5412 Radiculopathy, cervical region: Secondary | ICD-10-CM | POA: Insufficient documentation

## 2023-01-05 DIAGNOSIS — M6281 Muscle weakness (generalized): Secondary | ICD-10-CM | POA: Diagnosis present

## 2023-01-05 DIAGNOSIS — M62838 Other muscle spasm: Secondary | ICD-10-CM | POA: Insufficient documentation

## 2023-01-05 NOTE — Therapy (Addendum)
OUTPATIENT PHYSICAL THERAPY TREATMENT/Discharge   Patient Name: Melanie Chapman MRN: [id number] DOB:Nov 11, 1960, 62 y.o., female Today's Date: 01/05/2023   END OF SESSION:  PT End of Session - 01/05/23 0846     Visit Number 5    Number of Visits 13    Date for PT Re-Evaluation 01/25/23    Authorization Type [place] [employer] - VL: 27 (6 used)    Authorization Time Period 12/06/22 - 03/05/23    Authorization - Visit Number 4    Authorization - Number of Visits 12    Progress Note Due on Visit 10    PT Start Time 0846    PT Stop Time 0930    PT Time Calculation (min) 44 min    Activity Tolerance Patient tolerated treatment well    Behavior During Therapy [facility] for tasks assessed/performed               Past Medical History:  Diagnosis Date   Cervical radiculopathy 06/18/2022   Degenerative arthritis of right shoulder region 12/20/2019   History reviewed. No pertinent surgical history. Patient Active Problem List   Diagnosis Date Noted   Degenerative arthritis of right shoulder region 07/01/2022   Cervical radiculopathy 06/18/2022   Uterine fibroid 06/18/2022   Encounter for preventative adult health care examination 06/14/2022   Prediabetes 06/14/2022   Hyperlipidemia 06/14/2022   Primary osteoarthritis of left knee 06/05/2014    PCP: Quincy Simmonds, MD   REFERRING PROVIDER: Wynelle Link, MD  REFERRING DIAG: Z98.1 - History of spinal fusion  THERAPY DIAG:  Radiculopathy, cervical region  Abnormal posture  Muscle weakness (generalized)  Other muscle spasm  RATIONALE FOR EVALUATION AND TREATMENT: Rehabilitation  ONSET DATE: 10/14/22 - C5/6 ANTERIOR CERVICAL DISCECTOMY WITH FUSION   NEXT MD VISIT: 02/01/23   SUBJECTIVE:                                                                                                                                                                                                         SUBJECTIVE STATEMENT: Pt is  returning after a few weeks break due to another unrelated surgery.  She denies neck and shoulder pain today.    PAIN: Are you having pain? No  PERTINENT HISTORY:  C5/6 ACDF 10/14/22; cervical radiculopathy; DJD R shoulder; L knee OA  PRECAUTIONS: Cervical - NO LIFTING more than 10 pounds. NO overhead reaching. NO twisting of the neck.   HAND DOMINANCE: Right  WEIGHT BEARING RESTRICTIONS: No  FALLS:  Has patient fallen in last 6 months? 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  2.  Moderate to severe bilateral foraminal stenosis at C6-C7.  3.  Mild central canal stenosis and moderate bilateral foraminal stenosis at C4-C5 and C5-C6.  4.  Small central disc protrusion at C3-C4 with mild central canal narrowing.  5.  Abutment of the ventral cord surface at the C3-C4 through C5-C6 levels.   PATIENT SURVEYS:  NDI 28 / 50 = 56.0 %  COGNITION: Overall cognitive status: Within functional limits for tasks assessed  SENSATION: WFL Intermittent numbness in R index finger  POSTURE:  rounded shoulders and forward head  PALPATION: Increased muscle tension and TTP in R lateral cervicothoracic paraspinals, UT, LS, periscapular muscles, RTC, deltoids and pecs   CERVICAL ROM:   Active ROM eval  Flexion 30  Extension 37  Right lateral flexion 10  Left lateral flexion 12  Right rotation 33   Left rotation 38   (Blank rows = not tested)  UPPER EXTREMITY ROM: NT on eval due to overhead reaching limited by post-op precautions Active ROM    Shoulder flexion    Shoulder extension    Shoulder abduction    Shoulder adduction    Shoulder internal rotation    Shoulder external rotation    Elbow flexion    Elbow extension    Wrist flexion    Wrist extension    Wrist ulnar deviation    Wrist radial deviation    Wrist pronation    Wrist supination     (Blank rows = not tested)  UPPER EXTREMITY MMT:  MMT Right eval Left eval  Shoulder flexion 4 4+  Shoulder extension 4 5  Shoulder abduction 4 4+  Shoulder adduction    Shoulder internal rotation 4 4+  Shoulder external rotation 4- 4  Middle trapezius 4- 4  Lower trapezius 3+ 4-  Elbow flexion    Elbow extension    Wrist flexion    Wrist extension    Wrist ulnar deviation    Wrist radial deviation    Wrist pronation    Wrist supination    Grip strength     (Blank rows = not tested)    TODAY'S TREATMENT:   01/05/23 THERAPEUTIC EXERCISE: to improve flexibility, strength and mobility.  Verbal and tactile cues throughout for technique. UBE - L2.0 x 6 min (3' each fwd & back) Standing GTB scap retraction + B shoulder extension to neutral 10 x 3", 2 sets Standing GTB scap retraction + rows 10 x 3" Standing GTB scap retraction + B shoulder ER 10 x 3", standing with back along doorframe to promote scap retraction Standing GTB scap retraction + B shoulder horiz ABD 10 x 3", standing with back along doorframe to promote scap retraction Standing GTB scap retraction + B shoulder horiz ABD diagonals 10 x 3" each, standing with back along doorframe to promote scap retraction Wall pushup 2 x 10 Prone over green Pball - cervical & scapular retraction + thoracic extension +  I's & T's x 10 each - deferred Y's & W's due to abdominal discomfort from gas pains Seated thoracic extension + hands clasped behind head/neck for pec  stretch 10 x 5"   12/20/22 THERAPEUTIC EXERCISE: to improve flexibility, strength and mobility.  Verbal and tactile cues throughout for technique. UBE - L2.0 x 6 min (3' each fwd & back) Standing GTB rows + scap retraction 2x 10 x 3" On noodle: Standing GTB scap retraction + shoulder extension to neutral 10 x 3" B scap retraction + shoulder ER 2x10 x 3" B scap retraction + shoulder horiz ABD 2x10 x 3"  MANUAL THERAPY: To promote normalized muscle tension, improved flexibility, and reduced pain.  STM/DTM, manual to R UT & LS and suboccipitals Skilled palpation and monitoring of soft tissues during DN Trigger Point Dry-Needling  Treatment instructions: Expect mild to moderate muscle soreness. S/S of pneumothorax if dry needled over a lung field, and to seek immediate medical attention should they occur. Patient verbalized understanding of these instructions and education. Patient Consent Given: Yes Education handout provided: Previously provided Muscles treated: B UT and suboccipitals Electrical stimulation performed: No Parameters: N/A Treatment response/outcome: Twitch Response Elicited and Palpable Increase in Muscle Length  MODALITIES: MHP X 10 min to neck in prone post MT  12/10/22 THERAPEUTIC EXERCISE: to improve flexibility, strength and mobility.  Verbal and tactile cues throughout for technique. UBE - L2.0 x 6 min (3' each fwd & back) Standing GTB rows + scap retraction 10 x 3" Standing GTB scap retraction + shoulder extension to neutral 10 x 3" Hooklying pec stretch with hands behind head laying on pool noodle x 60" Hooklying on pool noodle: B scap retraction + shoulder ER 10 x 3" B scap retraction + shoulder horiz ABD 10 x 3"  MANUAL THERAPY: To promote normalized muscle tension, improved flexibility, and reduced pain.  STM/DTM, manual TPR and pin & stretch to R UT & LS B suboccipital release  R 1st rib mobs - grade II-III PA and inferior   PATIENT EDUCATION:   Education details: HEP update - wall pushups & median nerve glides   Person educated: Patient Education method: Explanation, Demonstration, and MedBridgeGO app updated Education comprehension: verbalized understanding, returned demonstration, and needs further education  HOME EXERCISE PROGRAM: *Access Code: [email or web address] URL: [email or web address] Date: 01/05/2023 Prepared by: Glenetta Hew  Exercises - Seated Cervical Retraction  - 2-3 x daily - 7 x weekly - 2 sets - 10 reps - 3-5 sec hold - Seated Scapular Resetting  - 2-3 x daily - 7 x weekly - 2 sets - 10 reps - 3 sec hold - Shoulder External Rotation and Scapular Retraction with Resistance  - 1 x daily - 7 x weekly - 2 sets - 10 reps - Shoulder Extension with Resistance  - 1 x daily - 7 x weekly - 2 sets - 10 reps - Standing Bilateral Low Shoulder Row with Anchored Resistance  - 1 x daily - 7 x weekly - 2 sets - 10 reps - Doorway Pec Stretch at 60 Degrees Abduction with Arm Straight  - 2 x daily - 7 x weekly - 3 sets - 3 reps - 30 sec hold - Wall Push Up  - 1 x daily - 7 x weekly - 2 sets - 10 reps - 3 sec hold - Standing Median Nerve Glide (Mirrored)  - 1 x daily - 7 x weekly - 2 sets - 10 reps - 3 sec hold - Median Nerve Glide (  Mirrored)  - 1 x daily - 7 x weekly - 2 sets - 10 reps - 3 sec hold  *[email or web address] access code texted to patient  ASSESSMENT:  CLINICAL IMPRESSION: Shavannah reports HEP going well, but upon review clarification necessary to distinguish between shoulder ER and horiz ABD during scapular strengthening - pt able to perform good return demonstration following review (STG #1 met).  We progressed scapular stabilization and strengthening exercises but limited tolerance for prone exercises on Pball due to abdominal discomfort from gas pains.  She she reports her pain has been better, but continues to report almost daily occurences of her R UE radiculopathy with pattern consistent with median nerve, therefore  introduced median nerve glides as well as reviewed previously provided brachial plexus nerve glide.  Natily will continue to benefit from skilled PT to address ongoing pain/radiculopathy, postural and strength deficits to improve mobility and activity tolerance with decreased pain interference.   OBJECTIVE IMPAIRMENTS: decreased activity tolerance, decreased endurance, decreased knowledge of condition, decreased mobility, decreased ROM, decreased strength, hypomobility, increased fascial restrictions, impaired perceived functional ability, increased muscle spasms, impaired flexibility, impaired sensation, impaired UE functional use, improper body mechanics, postural dysfunction, and pain.   ACTIVITY LIMITATIONS: carrying, lifting, bending, sitting, standing, bathing, dressing, reach over head, and hygiene/grooming  PARTICIPATION LIMITATIONS: meal prep, cleaning, laundry, driving, shopping, occupation, and yard work  PERSONAL FACTORS: Age, Fitness, Past/current experiences, Profession, Time since onset of injury/illness/exacerbation, and 1-2 comorbidities: DJD R shoulder/possible RTC injury and L knee OA  are also affecting patient's functional outcome.   REHAB POTENTIAL: Good  CLINICAL DECISION MAKING: Evolving/moderate complexity  EVALUATION COMPLEXITY: Moderate   GOALS: Goals reviewed with patient? Yes  SHORT TERM GOALS: Target date: 12/28/2022   Patient will be independent with initial HEP to improve outcomes and carryover.  Baseline: Initial HEP provided on eval Goal status: MET  01/05/23  2.  Patient will notice centralization of pain in R UE with exercises and ADLs.  Baseline: R UE radicular pain and numbness Goal status: IN PROGRESS  01/05/23 - Still having almost daily radicular symptoms but not constant  LONG TERM GOALS: Target date: 01/25/2023   Patient will be independent with ongoing/advanced HEP for self-management at home.  Baseline:  Goal status: IN PROGRESS  2.  Patient  will demonstrate improved posture to decrease muscle imbalance. Baseline: Forward head and rounded shoulder posture Goal status: IN PROGRESS  3.  Patient will report 50-75% improvement in neck pain to improve QOL.  Baseline: 5/10 Goal status: IN PROGRESS  4.  Patient will demonstrate functional pain free cervical ROM for safety with driving.  Baseline: Limited in all planes with twisting restricted per postop precautions Goal status: IN PROGRESS  5. Patient will demonstrate improved B shoulder strength to >/= 4+/5 for functional UE use. Baseline: Refer to above MMT table Goal status: IN PROGRESS  6.  Patient will report </= 41% on NDI to demonstrate improved functional ability.  Baseline: 28 / 50 = 56.0 % Goal status: IN PROGRESS  7.  Patient to report ability to perform ADLs, household, and work-related tasks without increased pain or triggering her UE radicular numbness/tingling.   Baseline:  Goal status: IN PROGRESS    PLAN:  PT FREQUENCY: 2x/week  PT DURATION: 8 weeks  PLANNED INTERVENTIONS: Therapeutic exercises, Therapeutic activity, Neuromuscular re-education, Patient/Family education, Self Care, Joint mobilization, Dry Needling, Spinal mobilization, Taping, Ultrasound, Manual therapy, and Re-evaluation  PLAN FOR NEXT SESSION: postural correction/strengthening, stretching and strengthening; MT +/- DN as  indicated   Marry Guan, PT  01/05/2023, 1:18 PM   PHYSICAL THERAPY DISCHARGE SUMMARY  Visits from Start of Care: 5  Current functional level related to goals / functional outcomes: See above   Remaining deficits: See above   Education / Equipment: HEP  Plan: Patient agrees to discharge.  Patient goals were not met. Patient is being discharged due to requesting 30 day hold after 01/05/2023 visit and cancelling remaining visits.  She has not returned to skilled therapy since that date and would require new order to return.    Jena Gauss, PT   02/16/2023 4:39 PM

## 2023-01-10 ENCOUNTER — Ambulatory Visit: Payer: Medicaid Other

## 2023-01-13 ENCOUNTER — Encounter: Payer: Medicaid Other | Admitting: Physical Therapy
